# Patient Record
Sex: Female | Born: 1980 | Race: Black or African American | Hispanic: No | Smoking: Never smoker
Health system: Southern US, Community
[De-identification: ages and names within clinical notes are randomized; demographics above are authoritative.]

## PROBLEM LIST (undated history)

## (undated) ENCOUNTER — Inpatient Hospital Stay (HOSPITAL_COMMUNITY): Payer: Self-pay

## (undated) DIAGNOSIS — I10 Essential (primary) hypertension: Secondary | ICD-10-CM

## (undated) DIAGNOSIS — D573 Sickle-cell trait: Secondary | ICD-10-CM

## (undated) DIAGNOSIS — L309 Dermatitis, unspecified: Secondary | ICD-10-CM

## (undated) DIAGNOSIS — F909 Attention-deficit hyperactivity disorder, unspecified type: Secondary | ICD-10-CM

## (undated) DIAGNOSIS — F319 Bipolar disorder, unspecified: Secondary | ICD-10-CM

## (undated) DIAGNOSIS — A749 Chlamydial infection, unspecified: Secondary | ICD-10-CM

## (undated) DIAGNOSIS — F419 Anxiety disorder, unspecified: Secondary | ICD-10-CM

## (undated) HISTORY — PX: WISDOM TOOTH EXTRACTION: SHX21

---

## 2008-01-16 ENCOUNTER — Inpatient Hospital Stay (HOSPITAL_COMMUNITY): Admission: AD | Admit: 2008-01-16 | Discharge: 2008-01-16 | Payer: Self-pay | Admitting: Obstetrics & Gynecology

## 2008-07-27 ENCOUNTER — Inpatient Hospital Stay (HOSPITAL_COMMUNITY): Admission: AD | Admit: 2008-07-27 | Discharge: 2008-07-27 | Payer: Self-pay | Admitting: Obstetrics and Gynecology

## 2008-08-01 ENCOUNTER — Inpatient Hospital Stay (HOSPITAL_COMMUNITY): Admission: AD | Admit: 2008-08-01 | Discharge: 2008-08-01 | Payer: Self-pay | Admitting: Obstetrics & Gynecology

## 2008-08-04 ENCOUNTER — Inpatient Hospital Stay (HOSPITAL_COMMUNITY): Admission: AD | Admit: 2008-08-04 | Discharge: 2008-08-04 | Payer: Self-pay | Admitting: Obstetrics & Gynecology

## 2008-08-06 ENCOUNTER — Inpatient Hospital Stay (HOSPITAL_COMMUNITY): Admission: AD | Admit: 2008-08-06 | Discharge: 2008-08-06 | Payer: Self-pay | Admitting: Obstetrics & Gynecology

## 2008-08-09 ENCOUNTER — Inpatient Hospital Stay (HOSPITAL_COMMUNITY): Admission: RE | Admit: 2008-08-09 | Discharge: 2008-08-09 | Payer: Self-pay | Admitting: Obstetrics & Gynecology

## 2008-08-16 ENCOUNTER — Inpatient Hospital Stay (HOSPITAL_COMMUNITY): Admission: RE | Admit: 2008-08-16 | Discharge: 2008-08-16 | Payer: Self-pay | Admitting: Obstetrics & Gynecology

## 2008-08-23 ENCOUNTER — Inpatient Hospital Stay (HOSPITAL_COMMUNITY): Admission: RE | Admit: 2008-08-23 | Discharge: 2008-08-23 | Payer: Self-pay | Admitting: Obstetrics & Gynecology

## 2008-10-26 ENCOUNTER — Inpatient Hospital Stay (HOSPITAL_COMMUNITY): Admission: AD | Admit: 2008-10-26 | Discharge: 2008-10-27 | Payer: Self-pay | Admitting: Obstetrics & Gynecology

## 2009-01-06 ENCOUNTER — Emergency Department (HOSPITAL_COMMUNITY): Admission: EM | Admit: 2009-01-06 | Discharge: 2009-01-06 | Payer: Self-pay | Admitting: Emergency Medicine

## 2009-01-12 ENCOUNTER — Emergency Department (HOSPITAL_COMMUNITY): Admission: EM | Admit: 2009-01-12 | Discharge: 2009-01-13 | Payer: Self-pay | Admitting: Emergency Medicine

## 2011-01-05 LAB — URINALYSIS, ROUTINE W REFLEX MICROSCOPIC
Glucose, UA: NEGATIVE mg/dL
Hgb urine dipstick: NEGATIVE
Urobilinogen, UA: 0.2 mg/dL (ref 0.0–1.0)
pH: 6 (ref 5.0–8.0)

## 2011-01-05 LAB — CBC
MCHC: 32.7 g/dL (ref 30.0–36.0)
MCV: 84.8 fL (ref 78.0–100.0)
RBC: 4.75 MIL/uL (ref 3.87–5.11)
RDW: 14.6 % (ref 11.5–15.5)
WBC: 7.9 10*3/uL (ref 4.0–10.5)

## 2011-01-05 LAB — GC/CHLAMYDIA PROBE AMP, GENITAL
Chlamydia, DNA Probe: NEGATIVE
GC Probe Amp, Genital: NEGATIVE

## 2011-01-05 LAB — WET PREP, GENITAL: Clue Cells Wet Prep HPF POC: NONE SEEN

## 2011-01-05 LAB — POCT PREGNANCY, URINE: Preg Test, Ur: NEGATIVE

## 2011-02-28 ENCOUNTER — Emergency Department (HOSPITAL_BASED_OUTPATIENT_CLINIC_OR_DEPARTMENT_OTHER)
Admission: EM | Admit: 2011-02-28 | Discharge: 2011-02-28 | Disposition: A | Discharge status: Home / Self Care | Payer: Self-pay | Attending: Emergency Medicine | Admitting: Emergency Medicine

## 2011-02-28 DIAGNOSIS — M62838 Other muscle spasm: Secondary | ICD-10-CM | POA: Insufficient documentation

## 2011-02-28 DIAGNOSIS — R0789 Other chest pain: Secondary | ICD-10-CM | POA: Insufficient documentation

## 2011-06-15 LAB — URINALYSIS, ROUTINE W REFLEX MICROSCOPIC
Glucose, UA: NEGATIVE
Hgb urine dipstick: NEGATIVE
Ketones, ur: NEGATIVE
Protein, ur: NEGATIVE
Specific Gravity, Urine: <1.005 — ABNORMAL LOW
Urobilinogen, UA: 0.2
pH: 7

## 2011-06-15 LAB — POCT PREGNANCY, URINE
Operator id: 25114
Preg Test, Ur: NEGATIVE

## 2011-06-15 LAB — WET PREP, GENITAL

## 2011-06-15 LAB — CBC
HCT: 34.5 — ABNORMAL LOW
MCHC: 32.7
WBC: 5.9

## 2011-06-22 LAB — URINALYSIS, ROUTINE W REFLEX MICROSCOPIC
Specific Gravity, Urine: 1.025
Urobilinogen, UA: 0.2
pH: 6

## 2011-06-22 LAB — CBC
HCT: 35.6 — ABNORMAL LOW
Hemoglobin: 11.1 — ABNORMAL LOW
Hemoglobin: 11.4 — ABNORMAL LOW
MCHC: 32.1
MCHC: 33.5
MCV: 82.8
RBC: 3.99
RBC: 4.28
RDW: 15.1
WBC: 8.9

## 2011-06-22 LAB — GC/CHLAMYDIA PROBE AMP, GENITAL
Chlamydia, DNA Probe: NEGATIVE
GC Probe Amp, Genital: NEGATIVE

## 2011-06-22 LAB — HCG, QUANTITATIVE, PREGNANCY: hCG, Beta Chain, Quant, S: 2583 — ABNORMAL HIGH

## 2011-06-22 LAB — POCT PREGNANCY, URINE: Preg Test, Ur: POSITIVE

## 2011-08-01 ENCOUNTER — Encounter: Payer: Self-pay | Admitting: *Deleted

## 2011-08-01 ENCOUNTER — Emergency Department (HOSPITAL_BASED_OUTPATIENT_CLINIC_OR_DEPARTMENT_OTHER)
Admission: EM | Admit: 2011-08-01 | Discharge: 2011-08-01 | Disposition: A | Discharge status: Home / Self Care | Payer: Self-pay | Attending: Emergency Medicine | Admitting: Emergency Medicine

## 2011-08-01 DIAGNOSIS — IMO0001 Reserved for inherently not codable concepts without codable children: Secondary | ICD-10-CM | POA: Insufficient documentation

## 2011-08-01 DIAGNOSIS — J111 Influenza due to unidentified influenza virus with other respiratory manifestations: Secondary | ICD-10-CM | POA: Insufficient documentation

## 2011-08-01 DIAGNOSIS — J069 Acute upper respiratory infection, unspecified: Secondary | ICD-10-CM

## 2011-08-01 NOTE — ED Notes (Signed)
Pt states that her family has been sick and she has been congested for about 1-1/2 weeks. Body aches started today. Taking OTC meds without relief.

## 2011-08-01 NOTE — ED Provider Notes (Signed)
History  Scribed for Hurman Horn, MD, the patient was seen in room MH10. This chart was scribed by Hillery Hunter.   CSN: [id number] Arrival date & time: 08/01/2011 10:04 PM   First MD Initiated Contact with Patient 08/01/11 2205      Chief Complaint  Patient presents with  . Generalized Body Aches    The history is provided by the patient.    Ebony Hansen is a 30 y.o. female who presents to the Emergency Department complaining of two weeks of congestion and dry cough and describes additional symptoms of diarrhea, congestion, rhinorrhea, chills, sneezing, watery eyes that started yesterday. She wanted to come in because these symptoms together with generalized myalgias have made her feel gradually worse through today. She describes sick contacts at home with child and spouse sick with different symptoms. Benadryl at home improved her symptoms transiently.   History reviewed. No pertinent past medical history.  Past Surgical History  Procedure Date  . Cesarean section     History reviewed. No pertinent family history.  History  Substance Use Topics  . Smoking status: Not on file  . Smokeless tobacco: Not on file  . Alcohol Use:     Review of Systems  Constitutional: Positive for chills (today).  HENT: Positive for congestion (worse today), rhinorrhea and postnasal drip. Negative for hearing loss, ear pain and ear discharge.   Eyes: Positive for discharge (Watery eyes).  Respiratory: Positive for cough (dry, improving).   Cardiovascular: Positive for chest pain (chest wall sore with myalgias).  Gastrointestinal: Positive for nausea and diarrhea (starting yesterday).  Musculoskeletal: Positive for myalgias (today) and back pain (soreness with other myalgias).  Skin: Negative for rash.  Neurological: Negative for syncope.  Psychiatric/Behavioral: Negative for confusion.    Allergies  Sulfa drugs cross reactors  Home Medications   Current  Outpatient Rx  Name Route Sig Dispense Refill  . DIPHENHYDRAMINE HCL 25 MG PO TABS Oral Take 25 mg by mouth once.      Marland Kitchen VITAMIN C 500 MG PO TABS Oral Take 500-1,000 mg by mouth daily.        Triage vitals: BP 111/58  Pulse 70  Temp(Src) 98.3 F (36.8 C) (Oral)  Resp 20  Ht 5\' 3"  (1.6 m)  Wt 120 lb (54.432 kg)  BMI 21.26 kg/m2  SpO2 99%  LMP 07/22/2011  Physical Exam  Nursing note and vitals reviewed. Constitutional:       Awake, alert, nontoxic appearance.  HENT:  Head: Atraumatic.  Eyes: Right eye exhibits no discharge. Left eye exhibits no discharge.  Neck: Neck supple.  Cardiovascular: Normal rate, regular rhythm and normal heart sounds.   Pulmonary/Chest: Effort normal. No respiratory distress. She has no wheezes. She has no rales. She exhibits no tenderness.  Abdominal: Soft. There is no tenderness. There is no rebound.  Musculoskeletal: She exhibits no tenderness.       Baseline ROM, no obvious new focal weakness.  Lymphadenopathy:    She has no cervical adenopathy.  Neurological:       Mental status and motor strength appears baseline for patient and situation.  Skin: No rash noted.  Psychiatric: She has a normal mood and affect.    ED Course  Procedures   Labs Reviewed - No data to display No results found.   OTHER DATA REVIEWED: Nursing notes, vital signs reviewed  DIAGNOSTIC STUDIES: Oxygen Saturation is 99% on room air, normal by my interpretation.     ED COURSE / COORDINATION  OF CARE: Patient / Family / Caregiver informed of clinical course, understand medical decision-making process, and agree with plan.    MDM  I doubt any other EMC precluding discharge at this time including, but not necessarily limited to the following:SBI.   1. Influenza   2. Upper respiratory infection       I personally performed the services described in this documentation, which was scribed in my presence. The recorded information has been reviewed and  considered.    Hurman Horn, MD 08/02/11 [phone]

## 2011-08-02 ENCOUNTER — Encounter (HOSPITAL_BASED_OUTPATIENT_CLINIC_OR_DEPARTMENT_OTHER): Payer: Self-pay

## 2013-08-20 ENCOUNTER — Encounter (HOSPITAL_BASED_OUTPATIENT_CLINIC_OR_DEPARTMENT_OTHER): Payer: Self-pay | Admitting: Emergency Medicine

## 2013-08-20 ENCOUNTER — Emergency Department (HOSPITAL_BASED_OUTPATIENT_CLINIC_OR_DEPARTMENT_OTHER)
Admission: EM | Admit: 2013-08-20 | Discharge: 2013-08-20 | Disposition: A | Discharge status: Home / Self Care | Payer: Managed Care, Other (non HMO) | Attending: Emergency Medicine | Admitting: Emergency Medicine

## 2013-08-20 DIAGNOSIS — Z79899 Other long term (current) drug therapy: Secondary | ICD-10-CM | POA: Insufficient documentation

## 2013-08-20 DIAGNOSIS — Z3202 Encounter for pregnancy test, result negative: Secondary | ICD-10-CM | POA: Insufficient documentation

## 2013-08-20 DIAGNOSIS — N390 Urinary tract infection, site not specified: Secondary | ICD-10-CM

## 2013-08-20 LAB — PREGNANCY, URINE: Preg Test, Ur: NEGATIVE

## 2013-08-20 LAB — URINALYSIS, ROUTINE W REFLEX MICROSCOPIC
Bilirubin Urine: NEGATIVE
Nitrite: NEGATIVE
Specific Gravity, Urine: 1.022 (ref 1.005–1.030)
Urobilinogen, UA: 1 mg/dL (ref 0.0–1.0)
pH: 6 (ref 5.0–8.0)

## 2013-08-20 LAB — URINE MICROSCOPIC-ADD ON

## 2013-08-20 MED ORDER — NITROFURANTOIN MONOHYD MACRO 100 MG PO CAPS: 100.0000 mg | ORAL_CAPSULE | Freq: Two times a day (BID) | ORAL | Status: DC

## 2013-08-20 NOTE — ED Provider Notes (Addendum)
CSN: [id number]     Arrival date & time 08/20/13  1242 History   First MD Initiated Contact with Patient 08/20/13 1246     Chief Complaint  Patient presents with  . Hematuria   (Consider location/radiation/quality/duration/timing/severity/associated sxs/prior Treatment) Patient is a 32 y.o. female presenting with hematuria. The history is provided by the patient.  Hematuria This is a new problem. Episode onset: 3 days ago. The problem occurs constantly. The problem has been gradually worsening. Associated symptoms include abdominal pain. Associated symptoms comments: No fever, nausea or vomiting. Exacerbated by: urinating. Nothing relieves the symptoms. She has tried nothing for the symptoms. The treatment provided no relief.    History reviewed. No pertinent past medical history. Past Surgical History  Procedure Laterality Date  . Cesarean section     No family history on file. History  Substance Use Topics  . Smoking status: Never Smoker   . Smokeless tobacco: Not on file  . Alcohol Use: No   OB History   Grav Para Term Preterm Abortions TAB SAB Ect Mult Living                 Review of Systems  Gastrointestinal: Positive for abdominal pain. Negative for nausea and vomiting.  Genitourinary: Positive for dysuria, frequency and hematuria. Negative for vaginal bleeding and vaginal discharge.  All other systems reviewed and are negative.    Allergies  Sulfa drugs cross reactors  Home Medications   Current Outpatient Rx  Name  Route  Sig  Dispense  Refill  . diphenhydrAMINE (BENADRYL ALLERGY) 25 MG tablet   Oral   Take 25 mg by mouth once.           . vitamin C (ASCORBIC ACID) 500 MG tablet   Oral   Take 500-1,000 mg by mouth daily.            BP 120/71  Pulse 66  Temp(Src) 98.1 F (36.7 C) (Oral)  Resp 16  SpO2 100%  LMP 08/13/2013 Physical Exam  Nursing note and vitals reviewed. Constitutional: She is oriented to person, place, and time. She appears  well-developed and well-nourished. No distress.  HENT:  Head: Normocephalic and atraumatic.  Mouth/Throat: Oropharynx is clear and moist.  Eyes: Conjunctivae and EOM are normal. Pupils are equal, round, and reactive to light.  Neck: Normal range of motion. Neck supple.  Cardiovascular: Normal rate, regular rhythm and intact distal pulses.   No murmur heard. Pulmonary/Chest: Effort normal and breath sounds normal. No respiratory distress. She has no wheezes. She has no rales.  Abdominal: Soft. She exhibits no distension. There is tenderness in the suprapubic area. There is no rebound, no guarding and no CVA tenderness.  Musculoskeletal: Normal range of motion. She exhibits no edema and no tenderness.  Neurological: She is alert and oriented to person, place, and time.  Skin: Skin is warm and dry. No rash noted. No erythema.  Psychiatric: She has a normal mood and affect. Her behavior is normal.    ED Course  Procedures (including critical care time) Labs Review Labs Reviewed  URINALYSIS, ROUTINE W REFLEX MICROSCOPIC - Abnormal; Notable for the following:    Color, Urine AMBER (*)    APPearance CLOUDY (*)    Hgb urine dipstick LARGE (*)    Ketones, ur 15 (*)    Protein, ur >300 (*)    Leukocytes, UA MODERATE (*)    All other components within normal limits  URINE MICROSCOPIC-ADD ON - Abnormal; Notable for the following:  Bacteria, UA MANY (*)    All other components within normal limits  URINE CULTURE  PREGNANCY, URINE   Imaging Review No results found.  EKG Interpretation   None       MDM   1. UTI (lower urinary tract infection)     Patient with symptoms most consistent with a UTI and UA confirming UTI. No physical exam signs or history concerning for pyelonephritis. Patient denies any vaginal symptoms and recently had a full evaluation and OB/GYN. Will treat with Macrobid this patient has a sulfa allergy.    Gwyneth Sprout, MD 08/20/13 1341  Gwyneth Sprout, MD 08/20/13 1343

## 2013-08-20 NOTE — ED Notes (Signed)
Pt reports blood in urine since Saturday. Pain in lower abdomen. No burning with urination. No hx of kidney stones.

## 2013-08-22 LAB — URINE CULTURE

## 2013-09-05 ENCOUNTER — Inpatient Hospital Stay (HOSPITAL_COMMUNITY)
Admission: AD | Admit: 2013-09-05 | Discharge: 2013-09-05 | Disposition: A | Discharge status: Home / Self Care | Payer: Managed Care, Other (non HMO) | Source: Ambulatory Visit | Attending: Obstetrics & Gynecology | Admitting: Obstetrics & Gynecology

## 2013-09-05 ENCOUNTER — Encounter (HOSPITAL_COMMUNITY): Payer: Self-pay | Admitting: *Deleted

## 2013-09-05 DIAGNOSIS — R109 Unspecified abdominal pain: Secondary | ICD-10-CM | POA: Insufficient documentation

## 2013-09-05 DIAGNOSIS — R102 Pelvic and perineal pain: Secondary | ICD-10-CM

## 2013-09-05 DIAGNOSIS — N949 Unspecified condition associated with female genital organs and menstrual cycle: Secondary | ICD-10-CM | POA: Insufficient documentation

## 2013-09-05 DIAGNOSIS — K644 Residual hemorrhoidal skin tags: Secondary | ICD-10-CM | POA: Insufficient documentation

## 2013-09-05 HISTORY — DX: Chlamydial infection, unspecified: A74.9

## 2013-09-05 HISTORY — DX: Sickle-cell trait: D57.3

## 2013-09-05 LAB — POCT PREGNANCY, URINE: Preg Test, Ur: NEGATIVE

## 2013-09-05 LAB — URINALYSIS, ROUTINE W REFLEX MICROSCOPIC
Glucose, UA: NEGATIVE mg/dL
Ketones, ur: NEGATIVE mg/dL
Leukocytes, UA: NEGATIVE
Nitrite: NEGATIVE
Protein, ur: NEGATIVE mg/dL
Urobilinogen, UA: 0.2 mg/dL (ref 0.0–1.0)

## 2013-09-05 LAB — WET PREP, GENITAL: Clue Cells Wet Prep HPF POC: NONE SEEN

## 2013-09-05 MED ORDER — KETOROLAC TROMETHAMINE 60 MG/2ML IM SOLN
60.0000 mg | Freq: Once | INTRAMUSCULAR | Status: AC
  Administered 2013-09-05: 60 mg via INTRAMUSCULAR
  Filled 2013-09-05: qty 2

## 2013-09-05 NOTE — MAU Note (Signed)
Abdominal pain since 12/1, dx'd with UTI @ that time.  Possibly pregnant.

## 2013-09-05 NOTE — MAU Provider Note (Signed)
History     CSN: [id number]  Arrival date and time: 09/05/13 1630   First Provider Initiated Contact with Patient 09/05/13 1723      Chief Complaint  Patient presents with  . Abdominal Pain   HPI Comments: Ebony Hansen 32 y.o. [id number] presents to MAU for low pelvic pains following UTI. She took all of her antibiotics and the pain returned after that. She is not on birth control as she and her husband are trying to get pregnant. The pain has been there for 2-3 weeks, comes and goes . She had a pelvic ultrasound in [place] about 2 weeks ago that was negative.      Past Medical History  Diagnosis Date  . Sickle cell trait   . Chlamydia     Past Surgical History  Procedure Laterality Date  . Cesarean section      History reviewed. No pertinent family history.  History  Substance Use Topics  . Smoking status: Never Smoker   . Smokeless tobacco: Not on file  . Alcohol Use: No    Allergies:  Allergies  Allergen Reactions  . Sulfa Drugs Cross Reactors Anaphylaxis and Other (See Comments)    Causes drooling.    Prescriptions prior to admission  Medication Sig Dispense Refill  . citalopram (CELEXA) 20 MG tablet Take 20 mg by mouth daily.      . traMADol (ULTRAM) 50 MG tablet Take 50 mg by mouth daily as needed (For back pain.).        Review of Systems  Constitutional: Negative.   HENT: Negative.   Respiratory: Negative.   Cardiovascular: Negative.   Gastrointestinal: Positive for abdominal pain.  Genitourinary: Negative.   Skin: Negative.   Neurological: Negative.   Psychiatric/Behavioral: Negative.    Physical Exam   Blood pressure 128/70, pulse 69, temperature 98.7 F (37.1 C), temperature source Oral, resp. rate 18, height 5\' 3"  (1.6 m), weight 72.031 kg (158 lb 12.8 oz), last menstrual period 08/06/2013.  Physical Exam  Constitutional: She is oriented to person, place, and time. She appears well-developed and well-nourished. No distress.   HENT:  Head: Normocephalic and atraumatic.  Cardiovascular: Normal rate, regular rhythm and normal heart sounds.   Respiratory: Effort normal and breath sounds normal.  GI: Soft. Bowel sounds are normal. There is tenderness.  Genitourinary:  External: Negative other than very large cluster of external hemorrhoids Vaginal: moderate amount thin white nonodorous discharge Cervix: negative Biman: Slight CMT  Musculoskeletal: Normal range of motion.  Neurological: She is alert and oriented to person, place, and time.  Skin: Skin is warm and dry.  Psychiatric: She has a normal mood and affect. Her behavior is normal. Judgment and thought content normal.   Results for orders placed during the hospital encounter of 09/05/13 (from the past 24 hour(s))  URINALYSIS, ROUTINE W REFLEX MICROSCOPIC     Status: None   Collection Time    09/05/13  5:00 PM      Result Value Range   Color, Urine YELLOW  YELLOW   APPearance CLEAR  CLEAR   Specific Gravity, Urine 1.010  1.005 - 1.030   pH 7.0  5.0 - 8.0   Glucose, UA NEGATIVE  NEGATIVE mg/dL   Hgb urine dipstick NEGATIVE  NEGATIVE   Bilirubin Urine NEGATIVE  NEGATIVE   Ketones, ur NEGATIVE  NEGATIVE mg/dL   Protein, ur NEGATIVE  NEGATIVE mg/dL   Urobilinogen, UA 0.2  0.0 - 1.0 mg/dL   Nitrite NEGATIVE  NEGATIVE  Leukocytes, UA NEGATIVE  NEGATIVE  POCT PREGNANCY, URINE     Status: None   Collection Time    09/05/13  5:33 PM      Result Value Range   Preg Test, Ur NEGATIVE  NEGATIVE  WET PREP, GENITAL     Status: Abnormal   Collection Time    09/05/13  6:51 PM      Result Value Range   Yeast Wet Prep HPF POC NONE SEEN  NONE SEEN   Trich, Wet Prep NONE SEEN  NONE SEEN   Clue Cells Wet Prep HPF POC NONE SEEN  NONE SEEN   WBC, Wet Prep HPF POC FEW (*) NONE SEEN    MAU Course  Procedures  MDM Wet prep Toradol 60 mg IM now/ pain is gone  Assessment and Plan   A: Pelvic pain  P: Toradol 60 mg Follow up with GYN   Carolynn Serve 09/05/2013, 7:17 PM

## 2013-09-07 NOTE — MAU Provider Note (Signed)
Attestation of Attending Supervision of Advanced Practitioner: Evaluation and management procedures were performed by the PA/NP/CNM/OB Fellow under my supervision/collaboration. Chart reviewed and agree with management and plan.  Eldred Sooy V 09/07/2013 8:24 PM

## 2013-10-06 ENCOUNTER — Encounter (HOSPITAL_BASED_OUTPATIENT_CLINIC_OR_DEPARTMENT_OTHER): Payer: Self-pay | Admitting: Emergency Medicine

## 2013-10-06 ENCOUNTER — Emergency Department (HOSPITAL_BASED_OUTPATIENT_CLINIC_OR_DEPARTMENT_OTHER)
Admission: EM | Admit: 2013-10-06 | Discharge: 2013-10-06 | Disposition: A | Discharge status: Home / Self Care | Payer: Managed Care, Other (non HMO) | Attending: Emergency Medicine | Admitting: Emergency Medicine

## 2013-10-06 DIAGNOSIS — Z862 Personal history of diseases of the blood and blood-forming organs and certain disorders involving the immune mechanism: Secondary | ICD-10-CM | POA: Insufficient documentation

## 2013-10-06 DIAGNOSIS — Z79899 Other long term (current) drug therapy: Secondary | ICD-10-CM | POA: Insufficient documentation

## 2013-10-06 DIAGNOSIS — J069 Acute upper respiratory infection, unspecified: Secondary | ICD-10-CM | POA: Insufficient documentation

## 2013-10-06 DIAGNOSIS — Z8619 Personal history of other infectious and parasitic diseases: Secondary | ICD-10-CM | POA: Insufficient documentation

## 2013-10-06 MED ORDER — IBUPROFEN 800 MG PO TABS
800.0000 mg | ORAL_TABLET | Freq: Once | ORAL | Status: AC
  Administered 2013-10-06: 800 mg via ORAL
  Filled 2013-10-06: qty 1

## 2013-10-06 NOTE — Discharge Instructions (Signed)
Cool Mist Vaporizers °Vaporizers may help relieve the symptoms of a cough and cold. They add moisture to the air, which helps mucus to become thinner and less sticky. This makes it easier to breathe and cough up secretions. Cool mist vaporizers do not cause serious burns like hot mist vaporizers ("steamers, humidifiers"). Vaporizers have not been proved to show they help with colds. You should not use a vaporizer if you are allergic to mold.  °HOME CARE INSTRUCTIONS °· Follow the package instructions for the vaporizer. °· Do not use anything other than distilled water in the vaporizer. °· Do not run the vaporizer all of the time. This can cause mold or bacteria to grow in the vaporizer. °· Clean the vaporizer after each time it is used. °· Clean and dry the vaporizer well before storing it. °· Stop using the vaporizer if worsening respiratory symptoms develop. °Document Released: 06/03/2004 Document Revised: 05/09/2013 Document Reviewed: 01/24/2013 °ExitCare® Patient Information ©2014 [employer]. ° °

## 2013-10-06 NOTE — ED Notes (Signed)
Pt. Reports she has had cold symptoms and cough since Monday and taking OTC meds.  Pt. Repots she has headache and bodyaches and stomach pain.  Pt. Reports cough making her sore.  Non productive cough.

## 2013-10-06 NOTE — ED Provider Notes (Signed)
CSN: [id number]     Arrival date & time 10/06/13  0026 History   First MD Initiated Contact with Patient 10/06/13 0041     Chief Complaint  Patient presents with  . Influenza   (Consider location/radiation/quality/duration/timing/severity/associated sxs/prior Treatment) Patient is a 33 y.o. female presenting with flu symptoms. The history is provided by the patient.  Influenza Presenting symptoms: cough and rhinorrhea   Presenting symptoms: no diarrhea and no shortness of breath   Cough:    Cough characteristics:  Non-productive   Severity:  Moderate   Onset quality:  Gradual   Duration:  5 days   Progression:  Unchanged   Chronicity:  New Rhinorrhea:    Quality:  Clear   Severity:  Moderate   Timing:  Constant   Progression:  Unchanged Severity:  Moderate Onset quality:  Gradual Progression:  Unchanged Chronicity:  New Relieved by:  Nothing Worsened by:  Nothing tried Ineffective treatments:  None tried Associated symptoms: nasal congestion   Associated symptoms: no chills, no mental status change and no neck stiffness   Risk factors: not elderly     Past Medical History  Diagnosis Date  . Sickle cell trait   . Chlamydia    Past Surgical History  Procedure Laterality Date  . Cesarean section     No family history on file. History  Substance Use Topics  . Smoking status: Never Smoker   . Smokeless tobacco: Not on file  . Alcohol Use: No   OB History   Grav Para Term Preterm Abortions TAB SAB Ect Mult Living   5 2 2  3 1 2   2      Review of Systems  Constitutional: Negative for chills.  HENT: Positive for congestion and rhinorrhea.   Respiratory: Positive for cough. Negative for shortness of breath.   Gastrointestinal: Negative for diarrhea.  Musculoskeletal: Negative for neck stiffness.  All other systems reviewed and are negative.    Allergies  Sulfa drugs cross reactors  Home Medications   Current Outpatient Rx  Name  Route  Sig  Dispense   Refill  . citalopram (CELEXA) 20 MG tablet   Oral   Take 20 mg by mouth daily.         . traMADol (ULTRAM) 50 MG tablet   Oral   Take 50 mg by mouth daily as needed (For back pain.).          BP 119/72  Pulse 73  Temp(Src) 98.7 F (37.1 C) (Oral)  Resp 20  SpO2 100%  LMP 09/07/2013 Physical Exam  Constitutional: She is oriented to person, place, and time. She appears well-developed and well-nourished. No distress.  HENT:  Head: Normocephalic and atraumatic.  Mouth/Throat: No oropharyngeal exudate.  Eyes: EOM are normal. Pupils are equal, round, and reactive to light.  Neck: Normal range of motion. Neck supple.  Cardiovascular: Normal rate, regular rhythm and intact distal pulses.   Pulmonary/Chest: Effort normal and breath sounds normal. No respiratory distress. She has no wheezes. She has no rales.  Abdominal: Soft. Bowel sounds are normal. There is no tenderness. There is no rebound and no guarding.  Musculoskeletal: Normal range of motion.  Lymphadenopathy:    She has no cervical adenopathy.  Neurological: She is alert and oriented to person, place, and time.  Skin: Skin is warm and dry.  Psychiatric: She has a normal mood and affect.    ED Course  Procedures (including critical care time) Labs Review Labs Reviewed - No data  to display Imaging Review No results found.  EKG Interpretation   None       MDM   1. URI (upper respiratory infection)    Alternate tylenol and ibuprofen and use cool mist vaporizer    Coleton Woon K Miriya Cloer-Rasch, MD 10/06/13 [phone]

## 2013-10-06 NOTE — ED Notes (Signed)
Body aches, cough, ha onset monday

## 2013-12-11 ENCOUNTER — Emergency Department (HOSPITAL_BASED_OUTPATIENT_CLINIC_OR_DEPARTMENT_OTHER)
Admission: EM | Admit: 2013-12-11 | Discharge: 2013-12-11 | Disposition: A | Discharge status: Home / Self Care | Payer: Managed Care, Other (non HMO) | Attending: Emergency Medicine | Admitting: Emergency Medicine

## 2013-12-11 ENCOUNTER — Encounter (HOSPITAL_BASED_OUTPATIENT_CLINIC_OR_DEPARTMENT_OTHER): Payer: Self-pay | Admitting: Emergency Medicine

## 2013-12-11 DIAGNOSIS — F909 Attention-deficit hyperactivity disorder, unspecified type: Secondary | ICD-10-CM | POA: Insufficient documentation

## 2013-12-11 DIAGNOSIS — F411 Generalized anxiety disorder: Secondary | ICD-10-CM | POA: Insufficient documentation

## 2013-12-11 DIAGNOSIS — Z8619 Personal history of other infectious and parasitic diseases: Secondary | ICD-10-CM | POA: Insufficient documentation

## 2013-12-11 DIAGNOSIS — Z79899 Other long term (current) drug therapy: Secondary | ICD-10-CM | POA: Insufficient documentation

## 2013-12-11 DIAGNOSIS — S61209A Unspecified open wound of unspecified finger without damage to nail, initial encounter: Secondary | ICD-10-CM | POA: Insufficient documentation

## 2013-12-11 DIAGNOSIS — Z862 Personal history of diseases of the blood and blood-forming organs and certain disorders involving the immune mechanism: Secondary | ICD-10-CM | POA: Insufficient documentation

## 2013-12-11 DIAGNOSIS — Y93E8 Activity, other personal hygiene: Secondary | ICD-10-CM | POA: Insufficient documentation

## 2013-12-11 DIAGNOSIS — W298XXA Contact with other powered powered hand tools and household machinery, initial encounter: Secondary | ICD-10-CM | POA: Insufficient documentation

## 2013-12-11 DIAGNOSIS — Y9289 Other specified places as the place of occurrence of the external cause: Secondary | ICD-10-CM | POA: Insufficient documentation

## 2013-12-11 DIAGNOSIS — S61309A Unspecified open wound of unspecified finger with damage to nail, initial encounter: Secondary | ICD-10-CM

## 2013-12-11 DIAGNOSIS — Z23 Encounter for immunization: Secondary | ICD-10-CM | POA: Insufficient documentation

## 2013-12-11 HISTORY — DX: Anxiety disorder, unspecified: F41.9

## 2013-12-11 HISTORY — DX: Attention-deficit hyperactivity disorder, unspecified type: F90.9

## 2013-12-11 MED ORDER — TETANUS-DIPHTH-ACELL PERTUSSIS 5-2.5-18.5 LF-MCG/0.5 IM SUSP
0.5000 mL | Freq: Once | INTRAMUSCULAR | Status: AC
  Administered 2013-12-11: 0.5 mL via INTRAMUSCULAR
  Filled 2013-12-11: qty 0.5

## 2013-12-11 NOTE — ED Notes (Addendum)
Shaved off top of left 4th fingernail with razor. Pt sts tetanus is UTD.

## 2013-12-11 NOTE — ED Provider Notes (Signed)
CSN: [id number]     Arrival date & time 12/11/13  2201 History  This chart was scribed for Charles B. Bernette MayersSheldon, MD by Beverly MilchJ Harrison Collins, ED Scribe. This patient was seen in room MH09/MH09 and the patient's care was started at 10:15 PM.   Chief Complaint  Patient presents with  . Extremity Laceration     The history is provided by the patient. No language interpreter was used.   HPI Comments: Ebony Hansen is a 33 y.o. female who presents to the Emergency Department complaining of laceration to left ring finger that occurred earlier this evening. She states she was in the shower shaving and caught her finger on the razor and hasn't been able to stop the bleeding. She reports wrapping it and applying pressure to stop bleeding. Pt states she is unsure of when she received her last tetanus shot.    Past Medical History  Diagnosis Date  . Sickle cell trait   . Chlamydia   . Anxiety   . ADHD (attention deficit hyperactivity disorder)    Past Surgical History  Procedure Laterality Date  . Cesarean section     No family history on file. History  Substance Use Topics  . Smoking status: Never Smoker   . Smokeless tobacco: Not on file  . Alcohol Use: No   OB History   Grav Para Term Preterm Abortions TAB SAB Ect Mult Living   5 2 2  3 1 2   2      Review of Systems A complete 10 system review of systems was obtained and all systems are negative except as noted in the HPI and PMH.     Allergies  Sulfa drugs cross reactors  Home Medications   Current Outpatient Rx  Name  Route  Sig  Dispense  Refill  . atomoxetine (STRATTERA) 10 MG capsule   Oral   Take 10 mg by mouth daily.         . citalopram (CELEXA) 20 MG tablet   Oral   Take 20 mg by mouth daily.         . traMADol (ULTRAM) 50 MG tablet   Oral   Take 50 mg by mouth daily as needed (For back pain.).          Triage Vitals: BP 121/72  Pulse 78  Temp(Src) 98.9 F (37.2 C) (Oral)  Resp 18  Ht 5\' 3"   (1.6 m)  Wt 158 lb (71.668 kg)  BMI 28.00 kg/m2  SpO2 100%  LMP 11/14/2013  Physical Exam  Nursing note and vitals reviewed. Constitutional: She is oriented to person, place, and time. She appears well-developed and well-nourished.  HENT:  Head: Normocephalic and atraumatic.  Neck: Neck supple.  Pulmonary/Chest: Effort normal.  Musculoskeletal:  Small, 75mmx5mm avulsion of distal nail on L ring finger with superficial skive injury of underlying nailbed, not in need of primary repair  Neurological: She is alert and oriented to person, place, and time. No cranial nerve deficit.  Psychiatric: She has a normal mood and affect. Her behavior is normal.    ED Course  Procedures (including critical care time)  DIAGNOSTIC STUDIES: Oxygen Saturation is 100% on RA, normal by my interpretation.    COORDINATION OF CARE: 10:18 PM- Pt advised of plan for treatment and pt agrees.    Labs Review Labs Reviewed - No data to display Imaging Review No results found.   EKG Interpretation None      MDM   Final diagnoses:  Fingernail avulsion, partial    Nail and nail bed injury not in need of repair. Tdap, non-stick dressing and wound care advise.   I personally performed the services described in this documentation, which was scribed in my presence. The recorded information has been reviewed and is accurate.      Charles B. Bernette Mayers, MD 12/11/13 2238

## 2013-12-11 NOTE — Discharge Instructions (Signed)
Fingernail or Toenail Loss  All or part of your fingernail or toenail has been lost. This may or may not grow back as a normal nail. A special non-stick bandage has been put on your finger or toe tightly to prevent bleeding.  HOME CARE INSTRUCTIONS   The tips of fingers and toes are full of nerves and injuries are often very painful. The following will help you decrease the pain and obtain the best outcome.  · Keep your hand or foot elevated above your heart to relieve pain and swelling. This will require lying in bed or on a couch with the hand or leg on pillows or sitting in a recliner with the leg up. Letting your hand or leg dangle may increase swelling, slow healing and cause throbbing pain.  · Keep your dressing dry and clean.  · Change your bandage in 24 hours after going home.  · After your bandage is changed, soak your hand or foot in warm soapy water for 10 to 20 minutes. Do this 3 times per day. This helps reduce pain and swelling. After soaking, apply a clean, dry bandage. Change your bandage if it is wet or dirty.  · Only take over-the-counter or prescription medicines for pain, discomfort, or fever as directed by your caregiver.  · See your caregiver as needed for problems.  SEEK IMMEDIATE MEDICAL CARE IF:   · You have increased pain, swelling, drainage, or bleeding.  · You have a fever.  MAKE SURE YOU:   · Understand these instructions.  · Will watch your condition.  · Will get help right away if you are not doing well or get worse.  Document Released: 07/29/2006 Document Revised: 11/29/2011 Document Reviewed: 10/18/2006  Exit[employer]® Patient Information ©2014 [employer].

## 2014-03-27 ENCOUNTER — Ambulatory Visit: Payer: Managed Care, Other (non HMO) | Attending: Family Medicine | Admitting: Rehabilitation

## 2014-04-10 ENCOUNTER — Ambulatory Visit: Payer: Managed Care, Other (non HMO)

## 2014-06-26 ENCOUNTER — Encounter (HOSPITAL_BASED_OUTPATIENT_CLINIC_OR_DEPARTMENT_OTHER): Payer: Self-pay | Admitting: Emergency Medicine

## 2014-06-26 ENCOUNTER — Emergency Department (HOSPITAL_BASED_OUTPATIENT_CLINIC_OR_DEPARTMENT_OTHER)
Admission: EM | Admit: 2014-06-26 | Discharge: 2014-06-26 | Disposition: A | Discharge status: Home / Self Care | Payer: BC Managed Care – PPO | Attending: Emergency Medicine | Admitting: Emergency Medicine

## 2014-06-26 DIAGNOSIS — F419 Anxiety disorder, unspecified: Secondary | ICD-10-CM | POA: Diagnosis not present

## 2014-06-26 DIAGNOSIS — Z79899 Other long term (current) drug therapy: Secondary | ICD-10-CM | POA: Diagnosis not present

## 2014-06-26 DIAGNOSIS — K088 Other specified disorders of teeth and supporting structures: Secondary | ICD-10-CM | POA: Insufficient documentation

## 2014-06-26 DIAGNOSIS — Z862 Personal history of diseases of the blood and blood-forming organs and certain disorders involving the immune mechanism: Secondary | ICD-10-CM | POA: Diagnosis not present

## 2014-06-26 DIAGNOSIS — K029 Dental caries, unspecified: Secondary | ICD-10-CM | POA: Diagnosis not present

## 2014-06-26 DIAGNOSIS — Z8619 Personal history of other infectious and parasitic diseases: Secondary | ICD-10-CM | POA: Diagnosis not present

## 2014-06-26 DIAGNOSIS — H9201 Otalgia, right ear: Secondary | ICD-10-CM | POA: Diagnosis present

## 2014-06-26 DIAGNOSIS — F909 Attention-deficit hyperactivity disorder, unspecified type: Secondary | ICD-10-CM | POA: Insufficient documentation

## 2014-06-26 DIAGNOSIS — K0889 Other specified disorders of teeth and supporting structures: Secondary | ICD-10-CM

## 2014-06-26 MED ORDER — PENICILLIN V POTASSIUM 500 MG PO TABS: 500.0000 mg | ORAL_TABLET | Freq: Four times a day (QID) | ORAL | Status: AC

## 2014-06-26 MED ORDER — KETOROLAC TROMETHAMINE 60 MG/2ML IM SOLN
60.0000 mg | Freq: Once | INTRAMUSCULAR | Status: AC
  Administered 2014-06-26: 60 mg via INTRAMUSCULAR
  Filled 2014-06-26: qty 2

## 2014-06-26 NOTE — ED Provider Notes (Signed)
CSN: [id number]     Arrival date & time 06/26/14  0259 History   First MD Initiated Contact with Patient 06/26/14 0413     Chief Complaint  Patient presents with  . Otalgia     (Consider location/radiation/quality/duration/timing/severity/associated sxs/prior Treatment) Patient is a 33 y.o. female presenting with ear pain.  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 Associated symptoms: no ear discharge, no fever and no sore throat   Associated symptoms comment:  Dental pain   Past Medical History  Diagnosis Date  . Sickle cell trait   . Chlamydia   . Anxiety   . ADHD (attention deficit hyperactivity disorder)    Past Surgical History  Procedure Laterality Date  . Cesarean section     No family history on file. History  Substance Use Topics  . Smoking status: Never Smoker   . Smokeless tobacco: Not on file  . Alcohol Use: No   OB History   Grav Para Term Preterm Abortions TAB SAB Ect Mult Living   5 2 2  3 1 2   2      Review of Systems  Constitutional: Negative for fever.  HENT: Positive for ear pain. Negative for ear discharge and sore throat.   All other systems reviewed and are negative.     Allergies  Sulfa drugs cross reactors  Home Medications   Prior to Admission medications   Medication Sig Start Date End Date Taking? Authorizing Provider  atomoxetine (STRATTERA) 10 MG capsule Take 10 mg by mouth daily.    Historical Provider, MD  citalopram (CELEXA) 20 MG tablet Take 20 mg by mouth daily.    Historical Provider, MD  traMADol (ULTRAM) 50 MG tablet Take 50 mg by mouth daily as needed (For back pain.).    Historical Provider, MD   BP 134/78  Pulse 86  Temp(Src)  98.9 F (37.2 C) (Oral)  Resp 18  Ht 5\' 3"  (1.6 m)  Wt 145 lb (65.772 kg)  BMI 25.69 kg/m2  SpO2 100%  LMP 06/06/2014 Physical Exam  Nursing note and vitals reviewed. Constitutional: She is oriented to person, place, and time. She appears well-developed and well-nourished. No distress.  HENT:  Head: Normocephalic and atraumatic.  Right Ear: Hearing, tympanic membrane, external ear and ear canal normal.  Left Ear: Hearing, tympanic membrane, external ear and ear canal normal.  Mouth/Throat: Mucous membranes are normal.  Severe dental caries.  TTP of right upper 1st molar which is severely decayed.    Eyes: Conjunctivae are normal. No scleral icterus.  Neck: Neck supple.  Cardiovascular: Normal rate and intact distal pulses.   Pulmonary/Chest: Effort normal. No stridor. No respiratory distress.  Abdominal: Normal appearance. She exhibits no distension.  Neurological: She is alert and oriented to person, place, and time.  Skin: Skin is warm and dry. No rash noted.  Psychiatric: She has a normal mood and affect. Her behavior is normal.    ED Course  Procedures (including critical care time) Labs Review Labs Reviewed - No data to display  Imaging Review No results found.   EKG Interpretation None      MDM   Final diagnoses:  Pain, dental    33 yo female with complaint of right ear pain.  By exam, pain originating from right upper tooth.  Some mild associated swelling with this tooth.  Will treat for dental abscess.  Advised her to see her dentist ASAP.      Warnell Foresterrey Lorenzo Arscott, MD 06/26/14 0500

## 2014-06-26 NOTE — ED Notes (Signed)
Pt c/o rt ear ache, rt lower tooth ache x2days

## 2014-07-22 ENCOUNTER — Encounter (HOSPITAL_BASED_OUTPATIENT_CLINIC_OR_DEPARTMENT_OTHER): Payer: Self-pay | Admitting: Emergency Medicine

## 2015-06-03 ENCOUNTER — Emergency Department (HOSPITAL_BASED_OUTPATIENT_CLINIC_OR_DEPARTMENT_OTHER): Payer: BLUE CROSS/BLUE SHIELD

## 2015-06-03 ENCOUNTER — Emergency Department (HOSPITAL_BASED_OUTPATIENT_CLINIC_OR_DEPARTMENT_OTHER)
Admission: EM | Admit: 2015-06-03 | Discharge: 2015-06-04 | Disposition: A | Discharge status: Home / Self Care | Payer: BLUE CROSS/BLUE SHIELD | Attending: Emergency Medicine | Admitting: Emergency Medicine

## 2015-06-03 ENCOUNTER — Encounter (HOSPITAL_BASED_OUTPATIENT_CLINIC_OR_DEPARTMENT_OTHER): Payer: Self-pay | Admitting: Emergency Medicine

## 2015-06-03 DIAGNOSIS — M791 Myalgia: Secondary | ICD-10-CM | POA: Insufficient documentation

## 2015-06-03 DIAGNOSIS — J069 Acute upper respiratory infection, unspecified: Secondary | ICD-10-CM | POA: Diagnosis not present

## 2015-06-03 DIAGNOSIS — R05 Cough: Secondary | ICD-10-CM | POA: Diagnosis present

## 2015-06-03 DIAGNOSIS — Z8619 Personal history of other infectious and parasitic diseases: Secondary | ICD-10-CM | POA: Insufficient documentation

## 2015-06-03 DIAGNOSIS — R1084 Generalized abdominal pain: Secondary | ICD-10-CM | POA: Diagnosis not present

## 2015-06-03 DIAGNOSIS — F909 Attention-deficit hyperactivity disorder, unspecified type: Secondary | ICD-10-CM | POA: Diagnosis not present

## 2015-06-03 DIAGNOSIS — Z79899 Other long term (current) drug therapy: Secondary | ICD-10-CM | POA: Insufficient documentation

## 2015-06-03 DIAGNOSIS — Z862 Personal history of diseases of the blood and blood-forming organs and certain disorders involving the immune mechanism: Secondary | ICD-10-CM | POA: Insufficient documentation

## 2015-06-03 DIAGNOSIS — E860 Dehydration: Secondary | ICD-10-CM | POA: Diagnosis not present

## 2015-06-03 DIAGNOSIS — F419 Anxiety disorder, unspecified: Secondary | ICD-10-CM | POA: Insufficient documentation

## 2015-06-03 DIAGNOSIS — R112 Nausea with vomiting, unspecified: Secondary | ICD-10-CM | POA: Diagnosis not present

## 2015-06-03 MED ORDER — ONDANSETRON HCL 4 MG/2ML IJ SOLN
4.0000 mg | Freq: Once | INTRAMUSCULAR | Status: AC
  Administered 2015-06-03: 4 mg via INTRAVENOUS
  Filled 2015-06-03: qty 2

## 2015-06-03 MED ORDER — ONDANSETRON HCL 8 MG PO TABS
4.0000 mg | ORAL_TABLET | Freq: Once | ORAL | Status: AC
  Administered 2015-06-03: 4 mg via ORAL
  Filled 2015-06-03: qty 1

## 2015-06-03 MED ORDER — ALBUTEROL SULFATE (2.5 MG/3ML) 0.083% IN NEBU
5.0000 mg | INHALATION_SOLUTION | Freq: Once | RESPIRATORY_TRACT | Status: AC
  Administered 2015-06-03: 5 mg via RESPIRATORY_TRACT
  Filled 2015-06-03: qty 6

## 2015-06-03 MED ORDER — PROMETHAZINE HCL 25 MG PO TABS: 25.0000 mg | ORAL_TABLET | Freq: Four times a day (QID) | ORAL | Status: DC | PRN

## 2015-06-03 MED ORDER — SODIUM CHLORIDE 0.9 % IV BOLUS (SEPSIS)
1000.0000 mL | Freq: Once | INTRAVENOUS | Status: AC
  Administered 2015-06-03: 1000 mL via INTRAVENOUS

## 2015-06-03 MED ORDER — ACETAMINOPHEN 325 MG PO TABS
650.0000 mg | ORAL_TABLET | Freq: Once | ORAL | Status: AC
  Administered 2015-06-04: 650 mg via ORAL
  Filled 2015-06-03: qty 2

## 2015-06-03 MED ORDER — PROMETHAZINE HCL 25 MG/ML IJ SOLN
12.5000 mg | Freq: Once | INTRAMUSCULAR | Status: AC
  Administered 2015-06-03: 12.5 mg via INTRAVENOUS
  Filled 2015-06-03: qty 1

## 2015-06-03 NOTE — ED Notes (Signed)
MD at bedside. 

## 2015-06-03 NOTE — Progress Notes (Signed)
Patient became nauseous while taking nebulizer and was unable to complete the breathing treatment.

## 2015-06-03 NOTE — ED Notes (Signed)
Patient to PMD today was given a steroid and antibiotic shot  - the patient reports that she is trying to eat and is nauseated. The patient is also coughing with wheezing. Patient reports that she is has stopped eating solid foods and now she is trying clear liquids and feels nauseated.

## 2015-06-03 NOTE — ED Notes (Signed)
Pt provided PO fluids at this time for PO challenge.

## 2015-06-03 NOTE — ED Provider Notes (Addendum)
CSN: [id number]     Arrival date & time 06/03/15  2030 History  This chart was scribed for Gwyneth Sprout, MD by Gwenyth Ober, ED Scribe. This patient was seen in room MH03/MH03 and the patient's care was started at 9:43 PM.    Chief Complaint  Patient presents with  . Cough   The history is provided by the patient. No language interpreter was used.    HPI Comments: Ebony Hansen is a 34 y.o. female who presents to the Emergency Department complaining of intermittent, moderate cough that started yesterday and became worse tonight. She states generalized body aches, nausea, wheezing and post-tussive vomiting as associated symptoms. Pt has tried her inhaler and a breathing treatment administered in the ED with some relief. Pt was seen by her PCP earlier today for the same and had a normal a chest x-ray. They administered steroids and an antibiotic in their office. Pt had her flu shot last week. She notes sick contact, with similar symptoms, at her school. Pt's LMP started 10 days ago. She denies smoking. Pt also denies dysuria as an associated symptom.   Past Medical History  Diagnosis Date  . Sickle cell trait   . Chlamydia   . Anxiety   . ADHD (attention deficit hyperactivity disorder)    Past Surgical History  Procedure Laterality Date  . Cesarean section     History reviewed. No pertinent family history. Social History  Substance Use Topics  . Smoking status: Never Smoker   . Smokeless tobacco: None  . Alcohol Use: No   OB History    Gravida Para Term Preterm AB TAB SAB Ectopic Multiple Living   Review of Systems  Respiratory: Positive for cough and wheezing.   Gastrointestinal: Positive for nausea, vomiting (post-tussive) and abdominal pain.  Genitourinary: Negative for dysuria.  Musculoskeletal: Positive for myalgias.  All other systems reviewed and are negative.     Allergies  Sulfa drugs cross reactors  Home Medications   Prior to  Admission medications   Medication Sig Start Date End Date Taking? Authorizing Provider  benzonatate (TESSALON) 100 MG capsule Take 100 mg by mouth 3 (three) times daily as needed for cough.   Yes Historical Provider, MD  cefdinir (OMNICEF) 300 MG capsule Take 300 mg by mouth 2 (two) times daily.   Yes Historical Provider, MD  atomoxetine (STRATTERA) 10 MG capsule Take 10 mg by mouth daily.    Historical Provider, MD  citalopram (CELEXA) 20 MG tablet Take 20 mg by mouth daily.    Historical Provider, MD  traMADol (ULTRAM) 50 MG tablet Take 50 mg by mouth daily as needed (For back pain.).    Historical Provider, MD   BP 134/85 mmHg  Pulse 2  Temp(Src) 99 F (37.2 C) (Oral)  Resp 16  Ht  (1.6 m)  Wt 142 lb (64.411 kg)  BMI 25.16 kg/m2  SpO2 100%  LMP 05/24/2015 Physical Exam  Constitutional: She appears well-developed and well-nourished. No distress.  HENT:  Head: Normocephalic and atraumatic.  Right Ear: External ear normal.  Left Ear: External ear normal.  Mouth/Throat: Oropharynx is clear and moist. No oropharyngeal exudate.  Eyes: Conjunctivae and EOM are normal.  Neck: Neck supple. No tracheal deviation present.  Cardiovascular: Normal rate, regular rhythm and normal heart sounds.  Exam reveals no gallop and no friction rub.   No murmur heard. Pulmonary/Chest: Effort normal and breath sounds normal.  No respiratory distress. She has no wheezes. She has no rales.  Abdominal: Soft. There is tenderness. There is no rebound and no guarding.  Mild generalized abdominal tenderness  Lymphadenopathy:    She has no cervical adenopathy.  Skin: Skin is warm and dry.  Psychiatric: She has a normal mood and affect. Her behavior is normal.  Nursing note and vitals reviewed.   ED Course  Procedures   DIAGNOSTIC STUDIES: Oxygen Saturation is 100% on RA, normal by my interpretation.    COORDINATION OF CARE: 9:51 PM Discussed treatment plan with pt which includes IV fluids and  nausea medication. Pt agreed to plan.   Labs Review Labs Reviewed - No data to display  Imaging Review Dg Chest 2 View  06/03/2015   CLINICAL DATA:  Shortness of breath cough headache body aches wheezing since last night  EXAM: CHEST  2 VIEW  COMPARISON:  01/13/2009  FINDINGS: The heart size and mediastinal contours are within normal limits. Both lungs are clear. The visualized skeletal structures are unremarkable.  IMPRESSION: No active cardiopulmonary disease.   Electronically Signed   By: Esperanza Heir M.D.   On: 06/03/2015 21:20   I have personally reviewed and evaluated these images as part of my medical decision-making.   EKG Interpretation None      MDM   Final diagnoses:  URI (upper respiratory infection)  Dehydration  Non-intractable vomiting with nausea, vomiting of unspecified type    Pt with symptoms consistent with viral URI starting yesterday.  Patient has had continued vomiting today and is unable to hold down anything. She was seen by her PCP earlier today and given a shot of steroids and antibiotics. She has steroids and antibiotics at home however because of continued vomiting she came here for further care.  No signs of breathing difficulty  No signs of pharyngitis, otitis or abnormal abdominal findings.   CXR wnl.  We will give IV fluids and nausea medication. On repeat evaluation patient still vomiting so gave Phenergan.  I personally performed the services described in this documentation, which was scribed in my presence.  The recorded information has been reviewed and considered.    Gwyneth Sprout, MD 06/03/15 1610  Gwyneth Sprout, MD 06/03/15 [id number]

## 2015-06-03 NOTE — Discharge Instructions (Signed)

## 2015-06-04 NOTE — ED Notes (Signed)
Pt attempting another PO challenge.

## 2016-08-11 ENCOUNTER — Inpatient Hospital Stay (HOSPITAL_COMMUNITY)
Admission: AD | Admit: 2016-08-11 | Discharge: 2016-08-12 | Disposition: A | Discharge status: Home / Self Care | Payer: BLUE CROSS/BLUE SHIELD | Source: Ambulatory Visit | Attending: Obstetrics and Gynecology | Admitting: Obstetrics and Gynecology

## 2016-08-11 DIAGNOSIS — R197 Diarrhea, unspecified: Secondary | ICD-10-CM | POA: Diagnosis not present

## 2016-08-11 DIAGNOSIS — O9989 Other specified diseases and conditions complicating pregnancy, childbirth and the puerperium: Secondary | ICD-10-CM | POA: Insufficient documentation

## 2016-08-11 DIAGNOSIS — R109 Unspecified abdominal pain: Secondary | ICD-10-CM | POA: Diagnosis not present

## 2016-08-11 DIAGNOSIS — Z888 Allergy status to other drugs, medicaments and biological substances status: Secondary | ICD-10-CM | POA: Insufficient documentation

## 2016-08-11 DIAGNOSIS — Z3A01 Less than 8 weeks gestation of pregnancy: Secondary | ICD-10-CM | POA: Diagnosis present

## 2016-08-11 DIAGNOSIS — O26891 Other specified pregnancy related conditions, first trimester: Secondary | ICD-10-CM | POA: Insufficient documentation

## 2016-08-11 DIAGNOSIS — R1032 Left lower quadrant pain: Secondary | ICD-10-CM

## 2016-08-11 DIAGNOSIS — Z79899 Other long term (current) drug therapy: Secondary | ICD-10-CM | POA: Insufficient documentation

## 2016-08-11 DIAGNOSIS — O99341 Other mental disorders complicating pregnancy, first trimester: Secondary | ICD-10-CM | POA: Insufficient documentation

## 2016-08-11 DIAGNOSIS — F419 Anxiety disorder, unspecified: Secondary | ICD-10-CM | POA: Insufficient documentation

## 2016-08-11 DIAGNOSIS — Z9889 Other specified postprocedural states: Secondary | ICD-10-CM | POA: Diagnosis not present

## 2016-08-11 DIAGNOSIS — F909 Attention-deficit hyperactivity disorder, unspecified type: Secondary | ICD-10-CM | POA: Diagnosis not present

## 2016-08-11 DIAGNOSIS — D573 Sickle-cell trait: Secondary | ICD-10-CM | POA: Insufficient documentation

## 2016-08-11 DIAGNOSIS — O009 Unspecified ectopic pregnancy without intrauterine pregnancy: Secondary | ICD-10-CM

## 2016-08-11 LAB — URINALYSIS, ROUTINE W REFLEX MICROSCOPIC
BILIRUBIN URINE: NEGATIVE
Glucose, UA: NEGATIVE mg/dL
Hgb urine dipstick: NEGATIVE
KETONES UR: 15 mg/dL — AB
LEUKOCYTES UA: NEGATIVE
NITRITE: NEGATIVE
PH: 6 (ref 5.0–8.0)
PROTEIN: NEGATIVE mg/dL
Specific Gravity, Urine: 1.02 (ref 1.005–1.030)

## 2016-08-11 LAB — WET PREP, GENITAL
Sperm: NONE SEEN
TRICH WET PREP: NONE SEEN
YEAST WET PREP: NONE SEEN

## 2016-08-11 LAB — POCT PREGNANCY, URINE: PREG TEST UR: POSITIVE — AB

## 2016-08-11 NOTE — MAU Provider Note (Signed)
History     CSN: [id number]  Arrival date and time: 08/11/16 2309   First Provider Initiated Contact with Patient 08/11/16 2329      Chief Complaint  Patient presents with  . Possible Pregnancy  . Abdominal Pain  . Vaginal Bleeding   Diarrhea Patient complains of diarrhea. Onset of diarrhea was 4 days ago. Diarrhea is occurring approximately 2 times per day. Patient describes diarrhea as watery. Diarrhea has been associated with abdominal pain described as aching. Patient denies blood in stool, fever, illness in household contacts, recent antibiotic use, recent camping.  Evaluation to date: none. Treatment to date: none.    Abdominal Pain  This is a new problem. The current episode started yesterday. The onset quality is sudden. The problem occurs constantly. The problem has been unchanged. The pain is located in the LLQ. The pain is at a severity of 6/10. The quality of the pain is cramping. The abdominal pain radiates to the back. Associated symptoms include diarrhea, nausea and vomiting. Pertinent negatives include no anorexia, arthralgias, belching, constipation, dysuria, fever, flatus, frequency, headaches, hematochezia, hematuria, melena or myalgias. The pain is aggravated by movement. The pain is relieved by being still. She has tried nothing for the symptoms. Her past medical history is significant for abdominal surgery. There is no history of colon cancer, Crohn's disease, gallstones, GERD, irritable bowel syndrome, pancreatitis, PUD or ulcerative colitis.    OB History    Gravida Para Term Preterm AB Living   5 2 2   3 2    SAB TAB Ectopic Multiple Live Births   2 1            Past Medical History:  Diagnosis Date  . ADHD (attention deficit hyperactivity disorder)   . Anxiety   . Chlamydia   . Sickle cell trait     Past Surgical History:  Procedure Laterality Date  . CESAREAN SECTION      No family history on file.  Social History  Substance Use Topics  .  Smoking status: Never Smoker  . Smokeless tobacco: Not on file  . Alcohol use No    Allergies:  Allergies  Allergen Reactions  . Sulfa Drugs Cross Reactors Anaphylaxis and Other (See Comments)    Causes drooling.    Prescriptions Prior to Admission  Medication Sig Dispense Refill Last Dose  . amphetamine-dextroamphetamine (ADDERALL XR) 25 MG 24 hr capsule Take 25 mg by mouth every morning.   Past Week at Unknown time  . traMADol (ULTRAM) 50 MG tablet Take 50 mg by mouth daily as needed (For back pain.).   Past Week at Unknown time  . atomoxetine (STRATTERA) 10 MG capsule Take 10 mg by mouth daily.     . benzonatate (TESSALON) 100 MG capsule Take 100 mg by mouth 3 (three) times daily as needed for cough.   06/03/2015 at Unknown time  . cefdinir (OMNICEF) 300 MG capsule Take 300 mg by mouth 2 (two) times daily.   06/03/2015 at Unknown time  . citalopram (CELEXA) 20 MG tablet Take 20 mg by mouth daily.   09/04/2013 at Unknown time  . promethazine (PHENERGAN) 25 MG tablet Take 1 tablet (25 mg total) by mouth every 6 (six) hours as needed for nausea or vomiting. 15 tablet 0     Review of Systems  Constitutional: Negative for fever.  HENT: Negative.   Eyes: Negative.   Respiratory: Negative.   Cardiovascular: Negative.   Gastrointestinal: Positive for abdominal pain, diarrhea, nausea and  vomiting. Negative for anorexia, constipation, flatus, hematochezia and melena.  Genitourinary: Negative for dysuria, frequency and hematuria.  Musculoskeletal: Negative for arthralgias and myalgias.  Skin: Negative.   Neurological: Negative for headaches.  Endo/Heme/Allergies: Negative.    Physical Exam   Blood pressure 122/73, pulse 78, temperature 98.5 F (36.9 C), temperature source Oral, resp. rate 16, height 5\' 3"  (1.6 m), weight 142 lb (64.4 kg), last menstrual period 07/04/2016.  Physical Exam  Constitutional: She is oriented to person, place, and time. She appears well-developed and  well-nourished.  HENT:  Head: Normocephalic.  Neck: Normal range of motion.  Respiratory: Effort normal. No respiratory distress. She has no wheezes. She has no rales. She exhibits no tenderness.  GI: Soft. She exhibits no distension and no mass. There is tenderness. There is guarding. There is no rebound.  Tender to palpation over left lower quadrant.   Genitourinary: Vagina normal and uterus normal. No vaginal discharge found.  Genitourinary Comments: Patient's external genitalia is normal-appearing with no lesions or discharge. Vaginal walls are pink and moist with no erythema; no lesions. Cervix is pink with no discharge or lesions or erythema. Multiple non-bleeding non-strangulated hemorroids present. On bimanual exam she is tender to palpation over the left lower quadrant.   Musculoskeletal: Normal range of motion.  Neurological: She is alert and oriented to person, place, and time.  Skin: Skin is warm and dry.  Psychiatric: She has a normal mood and affect.    MAU Course  Procedures  MDM -CBC/CMP-white count 8.2; H and H 11/32 -BETA of 3900 -US-positive IUP -ABO Typing -UA-15 ketones by urine specific gravity is 1.020 -wet prep and GC CT due to hx of chlamydia. Wet prep shows positive clue cells.  -Phenergan IM for nausea   Assessment and Plan   Patient Ebony Hansen is a [id number] here with complaints of abdominal pain since 11/21 and diarrhea since 11/20. Has had two watery stools today and two watery stools yesterday. Patient denies fever, chills, headache. Had a small amount of spotting on her underwear yesterday but none since.  Her US showed a SIUP with gestational age of [redacted] weeks and 2 days; gest sac and yolk sac visualized.   Patient is stable for discharge and feels better after IM phenergan.Hydration and BRAT diet recommendations given. Patient given prescription for flagyl but instructed not to take until her diarrhea and nausea have resolved; she is currently  asymptomatic for bacterial vaginosis.  Prescription for phenergen given. Patient to follow up with her ob-gyn as needed. Patient urged to discuss her medication needs with her ob-gyn, given that she has recently taken herself off of her ADHD medicines. She plans to keep her appointment on December 11.   Charlesetta GaribaldiKathryn Lorraine Kooistra CNM 08/11/2016, 11:54 PM

## 2016-08-12 ENCOUNTER — Inpatient Hospital Stay (HOSPITAL_COMMUNITY): Payer: BLUE CROSS/BLUE SHIELD

## 2016-08-12 DIAGNOSIS — R1032 Left lower quadrant pain: Secondary | ICD-10-CM | POA: Diagnosis not present

## 2016-08-12 LAB — CBC WITH DIFFERENTIAL/PLATELET
BASOS ABS: 0 10*3/uL (ref 0.0–0.1)
BASOS PCT: 1 %
EOS ABS: 0.2 10*3/uL (ref 0.0–0.7)
Eosinophils Relative: 2 %
HEMATOCRIT: 32 % — AB (ref 36.0–46.0)
HEMOGLOBIN: 11 g/dL — AB (ref 12.0–15.0)
Lymphocytes Relative: 38 %
Lymphs Abs: 3.1 10*3/uL (ref 0.7–4.0)
MCH: 25.9 pg — ABNORMAL LOW (ref 26.0–34.0)
MCHC: 34.4 g/dL (ref 30.0–36.0)
MCV: 75.5 fL — ABNORMAL LOW (ref 78.0–100.0)
Monocytes Absolute: 0.2 10*3/uL (ref 0.1–1.0)
Monocytes Relative: 3 %
NEUTROS ABS: 4.7 10*3/uL (ref 1.7–7.7)
NEUTROS PCT: 56 %
Platelets: 232 10*3/uL (ref 150–400)
RBC: 4.24 MIL/uL (ref 3.87–5.11)
RDW: 17.3 % — ABNORMAL HIGH (ref 11.5–15.5)
WBC: 8.2 10*3/uL (ref 4.0–10.5)

## 2016-08-12 LAB — COMPREHENSIVE METABOLIC PANEL
ALT: 10 U/L — AB (ref 14–54)
AST: 12 U/L — AB (ref 15–41)
Albumin: 3.7 g/dL (ref 3.5–5.0)
Alkaline Phosphatase: 38 U/L (ref 38–126)
Anion gap: 5 (ref 5–15)
BUN: 5 mg/dL — AB (ref 6–20)
CHLORIDE: 107 mmol/L (ref 101–111)
CO2: 26 mmol/L (ref 22–32)
CREATININE: 0.67 mg/dL (ref 0.44–1.00)
Calcium: 8.9 mg/dL (ref 8.9–10.3)
GFR calc Af Amer: >60 mL/min (ref >60)
GFR calc non Af Amer: >60 mL/min (ref >60)
Glucose, Bld: 102 mg/dL — ABNORMAL HIGH (ref 65–99)
POTASSIUM: 3.6 mmol/L (ref 3.5–5.1)
SODIUM: 138 mmol/L (ref 135–145)
Total Bilirubin: 0.4 mg/dL (ref 0.3–1.2)
Total Protein: 6.8 g/dL (ref 6.5–8.1)

## 2016-08-12 LAB — ABO/RH: ABO/RH(D): A POS

## 2016-08-12 LAB — HCG, QUANTITATIVE, PREGNANCY: HCG, BETA CHAIN, QUANT, S: 3907 m[IU]/mL — AB (ref <5)

## 2016-08-12 MED ORDER — METRONIDAZOLE 500 MG PO TABS: 500.0000 mg | ORAL_TABLET | Freq: Three times a day (TID) | ORAL | 0 refills | Status: AC

## 2016-08-12 MED ORDER — LOPERAMIDE HCL 2 MG PO CAPS: 2.0000 mg | ORAL_CAPSULE | ORAL | Status: DC | PRN

## 2016-08-12 MED ORDER — PROMETHAZINE HCL 25 MG/ML IJ SOLN
25.0000 mg | Freq: Four times a day (QID) | INTRAMUSCULAR | Status: DC | PRN
  Administered 2016-08-12: 25 mg via INTRAMUSCULAR
  Filled 2016-08-12: qty 1

## 2016-08-12 MED ORDER — PROMETHAZINE HCL 25 MG PO TABS: 25.0000 mg | ORAL_TABLET | Freq: Four times a day (QID) | ORAL | 1 refills | Status: DC | PRN

## 2016-08-12 NOTE — Discharge Instructions (Signed)

## 2016-08-13 LAB — GC/CHLAMYDIA PROBE AMP (~~LOC~~) NOT AT ARMC
CHLAMYDIA, DNA PROBE: NEGATIVE
NEISSERIA GONORRHEA: NEGATIVE

## 2016-08-13 NOTE — MAU Note (Addendum)
Pt was discharged and a pain assessment was done by K. Janann AugustWeiss RN at 0140 on 08/12/16. Pain assessment at that time was 0/10. The discharge order was cancelled which negated the pain assessment. IT informed RN that a pain assessment needed to be charted in order for the pt to be discharged. RN charted the same pain assessment from 0140 on 08/12/16 in order to discharge pt.

## 2016-08-17 DIAGNOSIS — Z3A36 36 weeks gestation of pregnancy: Secondary | ICD-10-CM | POA: Insufficient documentation

## 2016-08-17 DIAGNOSIS — K439 Ventral hernia without obstruction or gangrene: Secondary | ICD-10-CM | POA: Insufficient documentation

## 2016-08-18 ENCOUNTER — Encounter (HOSPITAL_COMMUNITY): Payer: Self-pay | Admitting: *Deleted

## 2016-08-18 ENCOUNTER — Inpatient Hospital Stay (HOSPITAL_COMMUNITY)
Admission: AD | Admit: 2016-08-18 | Discharge: 2016-08-18 | Disposition: A | Discharge status: Home / Self Care | Payer: BLUE CROSS/BLUE SHIELD | Source: Ambulatory Visit | Attending: Obstetrics & Gynecology | Admitting: Obstetrics & Gynecology

## 2016-08-18 ENCOUNTER — Inpatient Hospital Stay (HOSPITAL_COMMUNITY): Payer: BLUE CROSS/BLUE SHIELD

## 2016-08-18 DIAGNOSIS — Z8619 Personal history of other infectious and parasitic diseases: Secondary | ICD-10-CM | POA: Insufficient documentation

## 2016-08-18 DIAGNOSIS — F909 Attention-deficit hyperactivity disorder, unspecified type: Secondary | ICD-10-CM | POA: Insufficient documentation

## 2016-08-18 DIAGNOSIS — O208 Other hemorrhage in early pregnancy: Secondary | ICD-10-CM | POA: Diagnosis not present

## 2016-08-18 DIAGNOSIS — O26891 Other specified pregnancy related conditions, first trimester: Secondary | ICD-10-CM

## 2016-08-18 DIAGNOSIS — F419 Anxiety disorder, unspecified: Secondary | ICD-10-CM | POA: Insufficient documentation

## 2016-08-18 DIAGNOSIS — O468X1 Other antepartum hemorrhage, first trimester: Secondary | ICD-10-CM

## 2016-08-18 DIAGNOSIS — O200 Threatened abortion: Secondary | ICD-10-CM | POA: Diagnosis present

## 2016-08-18 DIAGNOSIS — Z3A01 Less than 8 weeks gestation of pregnancy: Secondary | ICD-10-CM | POA: Diagnosis not present

## 2016-08-18 DIAGNOSIS — Z882 Allergy status to sulfonamides status: Secondary | ICD-10-CM | POA: Diagnosis not present

## 2016-08-18 DIAGNOSIS — O418X10 Other specified disorders of amniotic fluid and membranes, first trimester, not applicable or unspecified: Secondary | ICD-10-CM

## 2016-08-18 DIAGNOSIS — D573 Sickle-cell trait: Secondary | ICD-10-CM | POA: Insufficient documentation

## 2016-08-18 DIAGNOSIS — R102 Pelvic and perineal pain: Secondary | ICD-10-CM

## 2016-08-18 LAB — URINALYSIS, ROUTINE W REFLEX MICROSCOPIC
BILIRUBIN URINE: NEGATIVE
GLUCOSE, UA: NEGATIVE mg/dL
KETONES UR: NEGATIVE mg/dL
NITRITE: NEGATIVE
PH: 6.5 (ref 5.0–8.0)
Protein, ur: NEGATIVE mg/dL
Specific Gravity, Urine: <1.005 — ABNORMAL LOW (ref 1.005–1.030)

## 2016-08-18 LAB — URINE MICROSCOPIC-ADD ON: Bacteria, UA: NONE SEEN

## 2016-08-18 LAB — CBC
HCT: 34.9 % — ABNORMAL LOW (ref 36.0–46.0)
HEMOGLOBIN: 11.8 g/dL — AB (ref 12.0–15.0)
MCH: 25.8 pg — AB (ref 26.0–34.0)
MCHC: 33.8 g/dL (ref 30.0–36.0)
MCV: 76.2 fL — AB (ref 78.0–100.0)
PLATELETS: 285 10*3/uL (ref 150–400)
RBC: 4.58 MIL/uL (ref 3.87–5.11)
RDW: 17.7 % — ABNORMAL HIGH (ref 11.5–15.5)
WBC: 9.4 10*3/uL (ref 4.0–10.5)

## 2016-08-18 LAB — HCG, QUANTITATIVE, PREGNANCY: HCG, BETA CHAIN, QUANT, S: 31825 m[IU]/mL — AB (ref <5)

## 2016-08-18 NOTE — MAU Provider Note (Signed)
Chief Complaint: Abdominal Pain and Vaginal Bleeding   First Provider Initiated Contact with Patient 08/18/16 1820      SUBJECTIVE HPI: Ebony Hansen is a 35 y.o. [id number] at 608w3d by LMP who presents to maternity admissions reporting pain starting 1 week ago, worsening today.  She also reports onset of brown bleeding when wiping today.  She has not tried any treatments, nothing makes the bleeding or pain better or worse.  The pain and bleeding are gradually worsening.  She has not started prenatal care but has appt with [facility] for her new OB visit.  Then, she plans to transfer to [facility] during the pregnancy and delivery in [place].  She denies vaginal itching/burning, urinary symptoms, h/a, dizziness, n/v, or fever/chills.     HPI  Past Medical History:  Diagnosis Date  . ADHD (attention deficit hyperactivity disorder)   . Anxiety   . Chlamydia   . Sickle cell trait [facility](HCC)    Past Surgical History:  Procedure Laterality Date  . CESAREAN SECTION    . WISDOM TOOTH EXTRACTION     Social History   Social History  . Marital status: Married    Spouse name: N/A  . Number of children: N/A  . Years of education: N/A   Occupational History  . Not on file.   Social History Main Topics  . Smoking status: Never Smoker  . Smokeless tobacco: Never Used  . Alcohol use No  . Drug use: No  . Sexual activity: Yes    Birth control/ protection: None   Other Topics Concern  . Not on file   Social History Narrative  . No narrative on file   No current facility-administered medications on file prior to encounter.    Current Outpatient Prescriptions on File Prior to Encounter  Medication Sig Dispense Refill  . metroNIDAZOLE (FLAGYL) 500 MG tablet Take 1 tablet (500 mg total) by mouth 3 (three) times daily. 21 tablet 0  . promethazine (PHENERGAN) 25 MG tablet Take 1 tablet (25 mg total) by mouth every 6 (six) hours as needed for nausea or vomiting. 15 tablet 0  .  promethazine (PHENERGAN) 25 MG tablet Take 1 tablet (25 mg total) by mouth every 6 (six) hours as needed for nausea or vomiting. (Patient not taking: Reported on 08/18/2016) 30 tablet 1   Allergies  Allergen Reactions  . Sulfa Drugs Cross Reactors Anaphylaxis and Other (See Comments)    Causes drooling.    ROS:  Review of Systems  Constitutional: Negative for chills, fatigue and fever.  Respiratory: Negative for shortness of breath.   Cardiovascular: Negative for chest pain.  Gastrointestinal: Positive for abdominal pain.  Genitourinary: Positive for pelvic pain and vaginal bleeding. Negative for difficulty urinating, dysuria, flank pain, vaginal discharge and vaginal pain.  Neurological: Negative for dizziness and headaches.  Psychiatric/Behavioral: Negative.      I have reviewed patient's Past Medical Hx, Surgical Hx, Family Hx, Social Hx, medications and allergies.   Physical Exam   Patient Vitals for the past 24 hrs:  BP Temp Pulse Resp Weight  08/18/16 1636 108/65 98.1 F (36.7 C) 85 16 148 lb 3.2 oz (67.2 kg)   Constitutional: Well-developed, well-nourished female in no acute distress.  Cardiovascular: normal rate Respiratory: normal effort GI: Abd soft, non-tender. Pos BS x 4 MS: Extremities nontender, no edema, normal ROM Neurologic: Alert and oriented x 4.  GU: Neg CVAT.  PELVIC EXAM: Cervix pink, visually closed, without lesion, moderate amount dark brown  bleeding, vaginal walls and external genitalia normal Bimanual exam: Cervix 0/long/high, firm, anterior, neg CMT, uterus nontender, slightly enlarged, adnexa without tenderness, enlargement, or mass   LAB RESULTS Results for orders placed or performed during the hospital encounter of 08/18/16 (from the past 24 hour(s))  Urinalysis, Routine w reflex microscopic (not at [facility])     Status: Abnormal   Collection Time: 08/18/16  4:46 PM  Result Value Ref Range   Color, Urine YELLOW YELLOW   APPearance HAZY (A)  CLEAR   Specific Gravity, Urine <1.005 (L) 1.005 - 1.030   pH 6.5 5.0 - 8.0   Glucose, UA NEGATIVE NEGATIVE mg/dL   Hgb urine dipstick LARGE (A) NEGATIVE   Bilirubin Urine NEGATIVE NEGATIVE   Ketones, ur NEGATIVE NEGATIVE mg/dL   Protein, ur NEGATIVE NEGATIVE mg/dL   Nitrite NEGATIVE NEGATIVE   Leukocytes, UA SMALL (A) NEGATIVE  Urine microscopic-add on     Status: Abnormal   Collection Time: 08/18/16  4:46 PM  Result Value Ref Range   Squamous Epithelial / LPF 6-30 (A) NONE SEEN   WBC, UA 0-5 0 - 5 WBC/hpf   RBC / HPF 0-5 0 - 5 RBC/hpf   Bacteria, UA NONE SEEN NONE SEEN  CBC     Status: Abnormal   Collection Time: 08/18/16  6:14 PM  Result Value Ref Range   WBC 9.4 4.0 - 10.5 K/uL   RBC 4.58 3.87 - 5.11 MIL/uL   Hemoglobin 11.8 (L) 12.0 - 15.0 g/dL   HCT 16.1 (L) 09.6 - 04.5 %   MCV 76.2 (L) 78.0 - 100.0 fL   MCH 25.8 (L) 26.0 - 34.0 pg   MCHC 33.8 30.0 - 36.0 g/dL   RDW 40.9 (H) 81.1 - 91.4 %   Platelets 285 150 - 400 K/uL  hCG, quantitative, pregnancy     Status: Abnormal   Collection Time: 08/18/16  6:14 PM  Result Value Ref Range   hCG, Beta Chain, Quant, S 31,825 (H) <5 mIU/mL    --/--/A POS (11/23 0002)  IMAGING US Ob Comp Less 14 Wks  Result Date: 08/12/2016 CLINICAL DATA:  Acute onset of severe lower abdominal cramping and vaginal spotting. Initial encounter. EXAM: OBSTETRIC <14 WK [place] AND TRANSVAGINAL OB US TECHNIQUE: Both transabdominal and transvaginal ultrasound examinations were performed for complete evaluation of the gestation as well as the maternal uterus, adnexal regions, and pelvic cul-de-sac. Transvaginal technique was performed to assess early pregnancy. COMPARISON:  Pelvic ultrasound performed 10/27/2008 FINDINGS: Intrauterine gestational sac: Single; visualized and normal in shape. Yolk sac:  Yes Embryo:  No Cardiac Activity: N/A MSD: 6.9  mm   5 w   2  d Subchorionic hemorrhage:  None visualized. Maternal uterus/adnexae: The uterus is otherwise  unremarkable. The ovaries are within normal limits. The right ovary measures with 3.5 x 1.8 x 3.4 cm, while the left ovary measures 3.3 x 1.4 x 2.7 cm. No suspicious adnexal masses are seen; there is no evidence for ovarian torsion. Trace free fluid is noted within the pelvic cul-de-sac. IMPRESSION: Single intrauterine gestational sac noted, with a mean sac diameter of 7 mm, corresponding to a gestational age of [redacted] weeks 2 days. This matches the gestational age of [redacted] weeks 3 days by LMP, reflecting an estimated date of delivery of April 10, 2017. No embryo yet seen, within normal limits. A yolk sac is visualized. Electronically Signed   By: Roanna Raider M.D.   On: 08/12/2016 00:43   US Ob Transvaginal  Result Date: 08/18/2016 CLINICAL DATA:  Pelvic pain. Spotting and cramping today. Gestational age by last menstrual period 6 weeks and 3 days. EXAM: TRANSVAGINAL OB ULTRASOUND TECHNIQUE: Transvaginal ultrasound was performed for complete evaluation of the gestation as well as the maternal uterus, adnexal regions, and pelvic cul-de-sac. COMPARISON:  Pelvic ultrasound August 12, 2016 FINDINGS: Intrauterine gestational sac: Present Yolk sac:  Present Embryo:  Present Cardiac Activity: Present Heart Rate: 99 bpm CRL:   History  mm   5 w 5 d                  [place] EDC: April 15, 2017 Subchorionic hemorrhage:  Small subchorionic hemorrhage. Maternal uterus/adnexae: 1.6 cm RIGHT corpus luteal cyst. Trace free fluid in pelvic cul-de-sac. IMPRESSION: Single live intrauterine pregnancy, gestational age by ultrasound 5 weeks and 5 days. Small subchorionic hemorrhage. Electronically Signed   By: Awilda Metro M.D.   On: 08/18/2016 18:56   US Ob Transvaginal  Result Date: 08/12/2016 CLINICAL DATA:  Acute onset of severe lower abdominal cramping and vaginal spotting. Initial encounter. EXAM: OBSTETRIC <14 WK [place] AND TRANSVAGINAL OB US TECHNIQUE: Both transabdominal and transvaginal ultrasound examinations were performed  for complete evaluation of the gestation as well as the maternal uterus, adnexal regions, and pelvic cul-de-sac. Transvaginal technique was performed to assess early pregnancy. COMPARISON:  Pelvic ultrasound performed 10/27/2008 FINDINGS: Intrauterine gestational sac: Single; visualized and normal in shape. Yolk sac:  Yes Embryo:  No Cardiac Activity: N/A MSD: 6.9  mm   5 w   2  d Subchorionic hemorrhage:  None visualized. Maternal uterus/adnexae: The uterus is otherwise unremarkable. The ovaries are within normal limits. The right ovary measures with 3.5 x 1.8 x 3.4 cm, while the left ovary measures 3.3 x 1.4 x 2.7 cm. No suspicious adnexal masses are seen; there is no evidence for ovarian torsion. Trace free fluid is noted within the pelvic cul-de-sac. IMPRESSION: Single intrauterine gestational sac noted, with a mean sac diameter of 7 mm, corresponding to a gestational age of [redacted] weeks 2 days. This matches the gestational age of [redacted] weeks 3 days by LMP, reflecting an estimated date of delivery of April 10, 2017. No embryo yet seen, within normal limits. A yolk sac is visualized. Electronically Signed   By: Roanna Raider M.D.   On: 08/12/2016 00:43    MAU Management/MDM: Ordered labs and [place] and reviewed results.  Discussed threatened miscarriage with small [facility] with pt.  Pt to f/u with prenatal care as scheduled. Pelvic rest while bleeding.  Bleeding precautions/reasons to return to MAU reviewed. Pt stable at time of discharge.  ASSESSMENT 1. Threatened miscarriage in early pregnancy   2. Pelvic pain affecting pregnancy in first trimester, antepartum   3. Subchorionic hemorrhage of placenta in first trimester, single or unspecified fetus     PLAN Discharge home with bleeding precautions   Medication List    TAKE these medications   albuterol 108 (90 Base) MCG/ACT inhaler Commonly known as:  PROVENTIL HFA;VENTOLIN HFA Inhale 2 puffs into the lungs every 6 (six) hours as needed for wheezing or  shortness of breath.   metroNIDAZOLE 500 MG tablet Commonly known as:  FLAGYL Take 1 tablet (500 mg total) by mouth 3 (three) times daily.   prenatal multivitamin Tabs tablet Take 1 tablet by mouth daily at 12 noon.   promethazine 25 MG tablet Commonly known as:  PHENERGAN Take 1 tablet (25 mg total) by mouth every 6 (six) hours as needed for  nausea or vomiting. What changed:  Another medication with the same name was removed. Continue taking this medication, and follow the directions you see here.      Follow-up Information    [facility] Follow up.   Why:  As scheduled, return to MAU as needed for emergencies          Sharen CounterLisa Leftwich-Kirby Certified Nurse-Midwife 08/18/2016  7:36 PM

## 2016-08-18 NOTE — Discharge Instructions (Signed)
Threatened Miscarriage A threatened miscarriage occurs when you have vaginal bleeding during your first 20 weeks of pregnancy but the pregnancy has not ended. If you have vaginal bleeding during this time, your health care provider will do tests to make sure you are still pregnant. If the tests show you are still pregnant and the developing baby (fetus) inside your womb (uterus) is still growing, your condition is considered a threatened miscarriage. A threatened miscarriage does not mean your pregnancy will end, but it does increase the risk of losing your pregnancy (complete miscarriage). What are the causes? The cause of a threatened miscarriage is usually not known. If you go on to have a complete miscarriage, the most common cause is an abnormal number of chromosomes in the developing baby. Chromosomes are the structures inside cells that hold all your genetic material. Some causes of vaginal bleeding that do not result in miscarriage include:  Having sex.  Having an infection.  Normal hormone changes of pregnancy.  Bleeding that occurs when an egg implants in your uterus. What increases the risk? Risk factors for bleeding in early pregnancy include:  Obesity.  Smoking.  Drinking excessive amounts of alcohol or caffeine.  Recreational drug use. What are the signs or symptoms?  Light vaginal bleeding.  Mild abdominal pain or cramps. How is this diagnosed? If you have bleeding with or without abdominal pain before 20 weeks of pregnancy, your health care provider will do tests to check whether you are still pregnant. One important test involves using sound waves and a computer (ultrasound) to create images of the inside of your uterus. Other tests include an internal exam of your vagina and uterus (pelvic exam) and measurement of your babys heart rate. You may be diagnosed with a threatened miscarriage if:  Ultrasound testing shows you are still pregnant.  Your babys heart rate  is strong.  A pelvic exam shows that the opening between your uterus and your vagina (cervix) is closed.  Your heart rate and blood pressure are stable.  Blood tests confirm you are still pregnant. How is this treated? No treatments have been shown to prevent a threatened miscarriage from going on to a complete miscarriage. However, the right home care is important. Follow these instructions at home:  Make sure you keep all your appointments for prenatal care. This is very important.  Get plenty of rest.  Do not have sex or use tampons if you have vaginal bleeding.  Do not douche.  Do not smoke or use recreational drugs.  Do not drink alcohol.  Avoid caffeine. Contact a health care provider if:  You have light vaginal bleeding or spotting while pregnant.  You have abdominal pain or cramping.  You have a fever. Get help right away if:  You have heavy vaginal bleeding.  You have blood clots coming from your vagina.  You have severe low back pain or abdominal cramps.  You have fever, chills, and severe abdominal pain. This information is not intended to replace advice given to you by your health care provider. Make sure you discuss any questions you have with your health care provider. Document Released: 09/06/2005 Document Revised: 02/12/2016 Document Reviewed: 07/03/2013 Elsevier Interactive Patient Education  2017 Elsevier Inc.  Subchorionic Hematoma A subchorionic hematoma is a gathering of blood between the outer wall of the placenta and the inner wall of the womb (uterus). The placenta is the organ that connects the fetus to the wall of the uterus. The placenta performs the feeding,  breathing (oxygen to the fetus), and waste removal (excretory work) of the fetus.  Subchorionic hematoma is the most common abnormality found on a result from ultrasonography done during the first trimester or early second trimester of pregnancy. If there has been little or no vaginal  bleeding, early small hematomas usually shrink on their own and do not affect your baby or pregnancy. The blood is gradually absorbed over 1-2 weeks. When bleeding starts later in pregnancy or the hematoma is larger or occurs in an older pregnant woman, the outcome may not be as good. Larger hematomas may get bigger, which increases the chances for miscarriage. Subchorionic hematoma also increases the risk of premature detachment of the placenta from the uterus, preterm (premature) labor, and stillbirth. HOME CARE INSTRUCTIONS  Stay on bed rest if your health care provider recommends this. Although bed rest will not prevent more bleeding or prevent a miscarriage, your health care provider may recommend bed rest until you are advised otherwise.  Avoid heavy lifting (more than 10 lb [4.5 kg]), exercise, sexual intercourse, or douching as directed by your health care provider.  Keep track of the number of pads you use each day and how soaked (saturated) they are. Write down this information.  Do not use tampons.  Keep all follow-up appointments as directed by your health care provider. Your health care provider may ask you to have follow-up blood tests or ultrasound tests or both. SEEK IMMEDIATE MEDICAL CARE IF:  You have severe cramps in your stomach, back, abdomen, or pelvis.  You have a fever.  You pass large clots or tissue. Save any tissue for your health care provider to look at.  Your bleeding increases or you become lightheaded, feel weak, or have fainting episodes. This information is not intended to replace advice given to you by your health care provider. Make sure you discuss any questions you have with your health care provider. Document Released: 12/22/2006 Document Revised: 09/27/2014 Document Reviewed: 04/05/2013 Elsevier Interactive Patient Education  2017 [employer]

## 2016-08-18 NOTE — MAU Note (Signed)
Stayed out of work today because she was cramping.  When she went to the bathroom, she saw blood, when wiped is brownish.  [facility] was told to come to office " couldn't do the drive", so came here.

## 2016-08-30 DIAGNOSIS — O09523 Supervision of elderly multigravida, third trimester: Secondary | ICD-10-CM | POA: Insufficient documentation

## 2016-08-30 DIAGNOSIS — Z98891 History of uterine scar from previous surgery: Secondary | ICD-10-CM | POA: Insufficient documentation

## 2016-09-01 DIAGNOSIS — Z8759 Personal history of other complications of pregnancy, childbirth and the puerperium: Secondary | ICD-10-CM | POA: Insufficient documentation

## 2016-09-15 ENCOUNTER — Encounter (HOSPITAL_COMMUNITY): Payer: Self-pay

## 2016-09-15 ENCOUNTER — Inpatient Hospital Stay (HOSPITAL_COMMUNITY)
Admission: AD | Admit: 2016-09-15 | Discharge: 2016-09-15 | Disposition: A | Discharge status: Home / Self Care | Payer: BLUE CROSS/BLUE SHIELD | Source: Ambulatory Visit | Attending: Obstetrics and Gynecology | Admitting: Obstetrics and Gynecology

## 2016-09-15 DIAGNOSIS — R1110 Vomiting, unspecified: Secondary | ICD-10-CM | POA: Diagnosis present

## 2016-09-15 DIAGNOSIS — O99011 Anemia complicating pregnancy, first trimester: Secondary | ICD-10-CM | POA: Diagnosis not present

## 2016-09-15 DIAGNOSIS — D573 Sickle-cell trait: Secondary | ICD-10-CM | POA: Diagnosis not present

## 2016-09-15 DIAGNOSIS — Z882 Allergy status to sulfonamides status: Secondary | ICD-10-CM | POA: Insufficient documentation

## 2016-09-15 DIAGNOSIS — O219 Vomiting of pregnancy, unspecified: Secondary | ICD-10-CM | POA: Insufficient documentation

## 2016-09-15 DIAGNOSIS — Z3A10 10 weeks gestation of pregnancy: Secondary | ICD-10-CM | POA: Diagnosis not present

## 2016-09-15 LAB — URINALYSIS, ROUTINE W REFLEX MICROSCOPIC
Bilirubin Urine: NEGATIVE
Glucose, UA: NEGATIVE mg/dL
HGB URINE DIPSTICK: NEGATIVE
Ketones, ur: NEGATIVE mg/dL
NITRITE: NEGATIVE
Protein, ur: NEGATIVE mg/dL
SPECIFIC GRAVITY, URINE: 1.015 (ref 1.005–1.030)
pH: 6 (ref 5.0–8.0)

## 2016-09-15 MED ORDER — SODIUM CHLORIDE 0.9 % IV SOLN
8.0000 mg | Freq: Once | INTRAVENOUS | Status: AC
  Administered 2016-09-15: 8 mg via INTRAVENOUS
  Filled 2016-09-15: qty 4

## 2016-09-15 MED ORDER — ONDANSETRON 8 MG PO TBDP: 8.0000 mg | ORAL_TABLET | Freq: Three times a day (TID) | ORAL | 0 refills | Status: DC | PRN

## 2016-09-15 MED ORDER — PROMETHAZINE HCL 25 MG PO TABS: 12.5000 mg | ORAL_TABLET | Freq: Four times a day (QID) | ORAL | 0 refills | Status: DC | PRN

## 2016-09-15 MED ORDER — DEXTROSE 5 % IN LACTATED RINGERS IV BOLUS
1000.0000 mL | Freq: Once | INTRAVENOUS | Status: AC
  Administered 2016-09-15: 1000 mL via INTRAVENOUS

## 2016-09-15 NOTE — MAU Provider Note (Signed)
History     CSN: [id number]  Arrival date and time: 09/15/16 2057   First Provider Initiated Contact with Patient 09/15/16 2151      Chief Complaint  Patient presents with  . Emesis   Emesis   This is a new problem. The current episode started in the past 7 days. The problem occurs 5 to 10 times per day. The problem has been unchanged. The emesis has an appearance of stomach contents. There has been no fever. Associated symptoms include abdominal pain and diarrhea. Pertinent negatives include no chills or fever. 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 Treatments tried: phenergan  The treatment provided mild relief.   Past Medical History:  Diagnosis Date  . ADHD (attention deficit hyperactivity disorder)   . Anxiety   . Chlamydia   . Sickle cell trait [facility](HCC)     Past Surgical History:  Procedure Laterality Date  . CESAREAN SECTION    . WISDOM TOOTH EXTRACTION      History reviewed. No pertinent family history.  Social History  Substance Use Topics  . Smoking status: Never Smoker  . Smokeless tobacco: Never Used  . Alcohol use No    Allergies:  Allergies  Allergen Reactions  . Sulfa Drugs Cross Reactors Anaphylaxis and Other (See Comments)    Causes drooling.    Prescriptions Prior to Admission  Medication Sig Dispense Refill Last Dose  . Prenatal Vit-Fe Fumarate-FA (PRENATAL MULTIVITAMIN) TABS tablet Take 1 tablet by mouth daily at 12 noon.   09/15/2016 at Unknown time  . promethazine (PHENERGAN) 25 MG tablet Take 1 tablet (25 mg total) by mouth every 6 (six) hours as needed for nausea or vomiting. 15 tablet 0 09/15/2016 at Unknown time  . albuterol (PROVENTIL HFA;VENTOLIN HFA) 108 (90 Base) MCG/ACT inhaler Inhale 2 puffs into the lungs every 6 (six) hours as needed for wheezing or shortness of breath.   rescue    Review of Systems  Constitutional: Negative for chills and fever.  Gastrointestinal: Positive for abdominal pain, diarrhea, nausea and  vomiting. Negative for constipation.  Genitourinary: Negative for dysuria, frequency and urgency.   Physical Exam   Blood pressure 121/64, pulse 81, temperature 98.1 F (36.7 C), temperature source Oral, resp. rate 18, height 5\' 3"  (1.6 m), weight 162 lb 4 oz (73.6 kg), last menstrual period 07/04/2016, SpO2 100 %.  Physical Exam  Nursing note and vitals reviewed. Constitutional: She is oriented to person, place, and time. She appears well-developed and well-nourished. No distress.  HENT:  Head: Normocephalic.  Cardiovascular: Normal rate.   Respiratory: Effort normal.  GI: Soft. There is no tenderness. There is no rebound.  Neurological: She is alert and oriented to person, place, and time.  Skin: Skin is warm and dry.  Psychiatric: She has a normal mood and affect.   Results for orders placed or performed during the hospital encounter of 09/15/16 (from the past 24 hour(s))  Urinalysis, Routine w reflex microscopic     Status: Abnormal   Collection Time: 09/15/16  9:24 PM  Result Value Ref Range   Color, Urine YELLOW YELLOW   APPearance HAZY (A) CLEAR   Specific Gravity, Urine 1.015 1.005 - 1.030   pH 6.0 5.0 - 8.0   Glucose, UA NEGATIVE NEGATIVE mg/dL   Hgb urine dipstick NEGATIVE NEGATIVE   Bilirubin Urine NEGATIVE NEGATIVE   Ketones, ur NEGATIVE NEGATIVE mg/dL   Protein, ur NEGATIVE NEGATIVE mg/dL   Nitrite NEGATIVE NEGATIVE   Leukocytes, UA SMALL (A) NEGATIVE  RBC / HPF 0-5 0 - 5 RBC/hpf   WBC, UA 6-30 0 - 5 WBC/hpf   Bacteria, UA MANY (A) NONE SEEN   Squamous Epithelial / LPF 6-30 (A) NONE SEEN   Mucous PRESENT    MAU Course  Procedures  MDM Reviewed risks of zofran in pregnancy. Patient is comfortable taking zofran on a limited basis at this time.   Patient has had 1L of D5LR and zofran. She is reports feeling better, and is tolerating PO. No vomiting while here.   Assessment and Plan   1. Nausea and vomiting during pregnancy   2. [redacted] weeks gestation of  pregnancy    DC home Comfort measures reviewed  1st Trimester precautions  RX: zofran PRN #20 , Phenergan PRN #30  Return to MAU as needed FU with OB as planned  Follow-up Information    [facility].   Specialty:  Obstetrics and Gynecology Contact information: [address] [place] [id number] [phone]           Tawnya CrookHogan, Elizabethanne Lusher Donovan 09/15/2016, 9:53 PM

## 2016-09-15 NOTE — MAU Note (Signed)
Patient states that she has been vomiting constantly. Patient states that she has been taking phenergan on and off for the past 3 days with no relief. Patient also has abdominal pain that she has had for the whole pregnancy. She also had some bleeding Sunday but is not bleeding currently.

## 2016-09-15 NOTE — Discharge Instructions (Signed)
Morning Sickness °Morning sickness is when you feel sick to your stomach (nauseous) during pregnancy. This nauseous feeling may or may not come with vomiting. It often occurs in the morning but can be a problem any time of day. Morning sickness is most common during the first trimester, but it may continue throughout pregnancy. While morning sickness is unpleasant, it is usually harmless unless you develop severe and continual vomiting (hyperemesis gravidarum). This condition requires more intense treatment. °What are the causes? °The cause of morning sickness is not completely known but seems to be related to normal hormonal changes that occur in pregnancy. °What increases the risk? °You are at greater risk if you: °· Experienced nausea or vomiting before your pregnancy. °· Had morning sickness during a previous pregnancy. °· Are pregnant with more than one baby, such as twins. ° °How is this treated? °Do not use any medicines (prescription, over-the-counter, or herbal) for morning sickness without first talking to your health care provider. Your health care provider may prescribe or recommend: °· Vitamin B6 supplements. °· Anti-nausea medicines. °· The herbal medicine ginger. ° °Follow these instructions at home: °· Only take over-the-counter or prescription medicines as directed by your health care provider. °· Taking multivitamins before getting pregnant can prevent or decrease the severity of morning sickness in most women. °· Eat a piece of dry toast or unsalted crackers before getting out of bed in the morning. °· Eat five or six small meals a day. °· Eat dry and bland foods (rice, baked potato). Foods high in carbohydrates are often helpful. °· Do not drink liquids with your meals. Drink liquids between meals. °· Avoid greasy, fatty, and spicy foods. °· Get someone to cook for you if the smell of any food causes nausea and vomiting. °· If you feel nauseous after taking prenatal vitamins, take the vitamins at  night or with a snack. °· Snack on protein foods (nuts, yogurt, cheese) between meals if you are hungry. °· Eat unsweetened gelatins for desserts. °· Wearing an acupressure wristband (worn for sea sickness) may be helpful. °· Acupuncture may be helpful. °· Do not smoke. °· Get a humidifier to keep the air in your house free of odors. °· Get plenty of fresh air. °Contact a health care provider if: °· Your home remedies are not working, and you need medicine. °· You feel dizzy or lightheaded. °· You are losing weight. °Get help right away if: °· You have persistent and uncontrolled nausea and vomiting. °· You pass out (faint). °This information is not intended to replace advice given to you by your health care provider. Make sure you discuss any questions you have with your health care provider. °Document Released: 10/28/2006 Document Revised: 02/12/2016 Document Reviewed: 02/21/2013 °Elsevier Interactive Patient Education © 2017 [employer] ° °

## 2017-01-23 DIAGNOSIS — O24419 Gestational diabetes mellitus in pregnancy, unspecified control: Secondary | ICD-10-CM | POA: Insufficient documentation

## 2017-02-11 DIAGNOSIS — O26893 Other specified pregnancy related conditions, third trimester: Secondary | ICD-10-CM | POA: Insufficient documentation

## 2017-03-15 DIAGNOSIS — O4700 False labor before 37 completed weeks of gestation, unspecified trimester: Secondary | ICD-10-CM | POA: Insufficient documentation

## 2017-03-16 DIAGNOSIS — O42919 Preterm premature rupture of membranes, unspecified as to length of time between rupture and onset of labor, unspecified trimester: Secondary | ICD-10-CM | POA: Insufficient documentation

## 2017-06-07 ENCOUNTER — Emergency Department (HOSPITAL_BASED_OUTPATIENT_CLINIC_OR_DEPARTMENT_OTHER)
Admission: EM | Admit: 2017-06-07 | Discharge: 2017-06-08 | Disposition: A | Discharge status: Home / Self Care | Payer: BLUE CROSS/BLUE SHIELD | Attending: Emergency Medicine | Admitting: Emergency Medicine

## 2017-06-07 ENCOUNTER — Encounter (HOSPITAL_BASED_OUTPATIENT_CLINIC_OR_DEPARTMENT_OTHER): Payer: Self-pay

## 2017-06-07 DIAGNOSIS — D573 Sickle-cell trait: Secondary | ICD-10-CM | POA: Diagnosis not present

## 2017-06-07 DIAGNOSIS — R51 Headache: Secondary | ICD-10-CM | POA: Diagnosis not present

## 2017-06-07 DIAGNOSIS — R519 Headache, unspecified: Secondary | ICD-10-CM

## 2017-06-07 DIAGNOSIS — I10 Essential (primary) hypertension: Secondary | ICD-10-CM | POA: Diagnosis present

## 2017-06-07 HISTORY — DX: Essential (primary) hypertension: I10

## 2017-06-07 NOTE — ED Triage Notes (Addendum)
Pt states she was dx with HTN while pregnant-delivered 3 mos ago-started on procardia-noncompliant and no f/u with PCP-c/o elevated BP and HA-NAD-steady gait

## 2017-06-08 NOTE — Discharge Instructions (Signed)
Keep a record of your blood pressure as discussed. Take with you to your next primary care appointment.

## 2017-06-08 NOTE — ED Provider Notes (Signed)
[facility]   CSN: [id number] Arrival date & time: 06/07/17  2128     History   Chief Complaint Chief Complaint  Patient presents with  . Hypertension    HPI Ebony Hansen is a 36 y.o. female.  Patient is three months post-partum from her third pregnancy. She developed PIH during the pregnancy.  She was started on procardia. She has not been on blood pressure medicine since. 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 Today during a headache, she noted her blood pressure to be elevated. She took aleve for the headache and it resolved.   The history is provided by the patient. No language interpreter was used.  Headache   This is a recurrent problem. The current episode started 12 to 24 hours ago. The problem has been resolved. The headache is associated with emotional stress. The pain is located in the frontal region. The patient is experiencing no pain. The pain does not radiate. Pertinent negatives include no nausea and no vomiting. She has tried NSAIDs for the symptoms. The treatment provided significant relief.    Past Medical History:  Diagnosis Date  . ADHD (attention deficit hyperactivity disorder)   . Anxiety   . Chlamydia   . Hypertension   . Sickle cell trait (HCC)     There are no active problems to display for this patient.   Past Surgical History:  Procedure Laterality Date  . CESAREAN SECTION    . WISDOM TOOTH EXTRACTION      OB History    Gravida Para Term Preterm AB Living   SAB TAB Ectopic Multiple Live Births   2 1             Home Medications    Prior to Admission medications   Not on File    Family History No family history on file.  Social History Social History  Substance Use Topics  . Smoking status: Never Smoker  . Smokeless tobacco: Never Used  . Alcohol use No     Allergies   Sulfa drugs cross reactors   Review of Systems Review of  Systems  Gastrointestinal: Negative for nausea and vomiting.  Neurological: Positive for headaches.  All other systems reviewed and are negative.    Physical Exam Updated Vital Signs BP (!) 149/91 (BP Location: Left Arm)   Pulse 60   Temp 98.9 F (37.2 C) (Oral)   Resp 18   Ht  (1.6 m)   Wt 82.1 kg (181 lb)   LMP 05/31/2017   SpO2 100%   Breastfeeding? Unknown   BMI 32.06 kg/m   Physical Exam  Constitutional: She is oriented to person, place, and time. She appears well-developed and well-nourished.  HENT:  Head: Normocephalic.  Eyes: Pupils are equal, round, and reactive to light. Conjunctivae and EOM are normal.  Neck: Neck supple.  Cardiovascular: Normal rate and regular rhythm.   Blood pressure checked manually:  122/78  Pulmonary/Chest: Effort normal and breath sounds normal.  Abdominal: Soft.  Musculoskeletal: Normal range of motion.  Neurological: She is alert and oriented to person, place, and time. No cranial nerve deficit or sensory deficit. Coordination normal.  Skin: Skin is warm and dry.  Psychiatric: She has a normal mood and affect.  Nursing note and vitals reviewed.    ED Treatments / Results  Labs (all labs ordered are listed, but only abnormal  results are displayed) Labs Reviewed - No data to display  EKG  EKG Interpretation None       Radiology No results found.  Procedures Procedures (including critical care time)  Medications Ordered in ED Medications - No data to display   Initial Impression / Assessment and Plan / ED Course  I have reviewed the triage vital signs and the nursing notes.  Pertinent labs & imaging results that were available during my care of the patient were reviewed by me and considered in my medical decision making (see chart for details).     Pt HA treated and improved while in ED.  Presentation is  non concerning for [facility], ICH, Meningitis, or temporal arteritis. Pt is afebrile with no focal neuro  deficits, nuchal rigidity, or change in vision. Pt is to follow up with PCP to discuss prophylactic medication. Pt verbalizes understanding and is agreeable with plan to dc.   Patient noted to be hypertensive in the emergency department.  No signs of hypertensive urgency.  Discussed with patient the need for close follow-up and management by their primary care physician.   Final Clinical Impressions(s) / ED Diagnoses   Final diagnoses:  None    New Prescriptions New Prescriptions   No medications on file     Felicie Morn, NP 06/08/17 0032    Paula Libra, MD 06/08/17 [phone]

## 2017-06-08 NOTE — ED Notes (Signed)
PT discharged to home with family. NAD. 

## 2017-11-08 ENCOUNTER — Emergency Department (HOSPITAL_BASED_OUTPATIENT_CLINIC_OR_DEPARTMENT_OTHER)
Admission: EM | Admit: 2017-11-08 | Discharge: 2017-11-08 | Disposition: A | Discharge status: Home / Self Care | Payer: BLUE CROSS/BLUE SHIELD | Attending: Emergency Medicine | Admitting: Emergency Medicine

## 2017-11-08 ENCOUNTER — Emergency Department (HOSPITAL_BASED_OUTPATIENT_CLINIC_OR_DEPARTMENT_OTHER): Payer: BLUE CROSS/BLUE SHIELD

## 2017-11-08 ENCOUNTER — Encounter (HOSPITAL_BASED_OUTPATIENT_CLINIC_OR_DEPARTMENT_OTHER): Payer: Self-pay | Admitting: Emergency Medicine

## 2017-11-08 ENCOUNTER — Other Ambulatory Visit: Payer: Self-pay

## 2017-11-08 DIAGNOSIS — J111 Influenza due to unidentified influenza virus with other respiratory manifestations: Secondary | ICD-10-CM | POA: Diagnosis not present

## 2017-11-08 DIAGNOSIS — D573 Sickle-cell trait: Secondary | ICD-10-CM | POA: Insufficient documentation

## 2017-11-08 DIAGNOSIS — R05 Cough: Secondary | ICD-10-CM | POA: Diagnosis present

## 2017-11-08 DIAGNOSIS — I10 Essential (primary) hypertension: Secondary | ICD-10-CM | POA: Diagnosis not present

## 2017-11-08 MED ORDER — IBUPROFEN 400 MG PO TABS
400.0000 mg | ORAL_TABLET | Freq: Once | ORAL | Status: AC
  Administered 2017-11-08: 400 mg via ORAL
  Filled 2017-11-08: qty 1

## 2017-11-08 MED ORDER — GUAIFENESIN 100 MG/5ML PO SOLN
5.0000 mL | Freq: Once | ORAL | Status: AC
  Administered 2017-11-08: 100 mg via ORAL
  Filled 2017-11-08: qty 10

## 2017-11-08 MED ORDER — GUAIFENESIN-DM 100-10 MG/5ML PO SYRP
15.0000 mL | ORAL_SOLUTION | Freq: Once | ORAL | Status: DC
  Filled 2017-11-08: qty 15

## 2017-11-08 NOTE — ED Triage Notes (Signed)
Pt c/o cold/sinus sxs x 2 wks; fatigue today

## 2017-11-08 NOTE — Discharge Instructions (Signed)
It was our pleasure to provide your ER care today - we hope that you feel better.  Your chest xray was read as being normal/good.  Your symptoms appear most consistent with the flu or a flu-like illness.   Take acetaminophen and/or ibuprofen as need for fever and body aches.   You may also try an over the counter cold/flu medication as need for symptom relief.  Rest. Drink adequate fluids.  Follow up with primary care doctor in 1 week if symptoms fail to improve/resolve.  Return to ER if worse, increased trouble breathing, other concern.

## 2017-11-08 NOTE — ED Provider Notes (Signed)
[facility] EMERGENCY DEPARTMENT Provider Note   CSN: [id number] Arrival date & time: 11/08/17  [id number]     History   Chief Complaint Chief Complaint  Patient presents with  . Cough  . Nasal Congestion    HPI Ebony Hansen is a 37 y.o. female.  Patient c/o productive cough, congestion, body aches, subjective fever, in past week. 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 No neck pain/stiffness. No chest pain or sob. No abd pain or nvd. Symptoms moderate, persistent.    The history is provided by the patient.  Cough  Associated symptoms include myalgias. Pertinent negatives include no chest pain, no sore throat, no shortness of breath and no eye redness.    Past Medical History:  Diagnosis Date  . ADHD (attention deficit hyperactivity disorder)   . Anxiety   . Chlamydia   . Hypertension   . Sickle cell trait (HCC)     There are no active problems to display for this patient.   Past Surgical History:  Procedure Laterality Date  . CESAREAN SECTION    . WISDOM TOOTH EXTRACTION      OB History    Gravida Para Term Preterm AB Living   6 2 2   3 2    SAB TAB Ectopic Multiple Live Births   2 1             Home Medications    Prior to Admission medications   Not on File    Family History No family history on file.  Social History Social History   Tobacco Use  . Smoking status: Never Smoker  . Smokeless tobacco: Never Used  Substance Use Topics  . Alcohol use: No  . Drug use: No     Allergies   Sulfa drugs cross reactors   Review of Systems Review of Systems  Constitutional: Positive for fever.  HENT: Positive for congestion. Negative for sore throat.   Eyes: Negative for redness.  Respiratory: Positive for cough. Negative for shortness of breath.   Cardiovascular: Negative for chest pain.  Gastrointestinal: Negative for abdominal pain and vomiting.  Genitourinary: Negative for flank pain.    Musculoskeletal: Positive for myalgias. Negative for neck pain and neck stiffness.  Skin: Negative for rash.  Neurological: Negative for light-headedness.  Hematological: Does not bruise/bleed easily.  Psychiatric/Behavioral: Negative for confusion.     Physical Exam Updated Vital Signs BP (!) 134/94 (BP Location: Left Arm)   Pulse 64   Temp 98.8 F (37.1 C) (Oral)   Resp 20   LMP 10/23/2017   SpO2 100%   Physical Exam  Constitutional: She appears well-developed and well-nourished. No distress.  HENT:  Mouth/Throat: Oropharynx is clear and moist.  Nasal congestion  Eyes: Conjunctivae are normal. No scleral icterus.  Neck: Neck supple. No tracheal deviation present.  Cardiovascular: Normal rate, regular rhythm, normal heart sounds and intact distal pulses. Exam reveals no gallop and no friction rub.  No murmur heard. Pulmonary/Chest: Effort normal. No respiratory distress.  Coughing, upper resp congestion  Abdominal: Soft. Normal appearance. She exhibits no distension. There is no tenderness.  Musculoskeletal: She exhibits no edema.  Neurological: She is alert.  Skin: Skin is warm and dry. No rash noted. She is not diaphoretic.  Psychiatric: She has a normal mood and affect.  Nursing note and vitals reviewed.    ED Treatments / Results  Labs (all labs ordered are listed, but only abnormal  results are displayed) Labs Reviewed - No data to display  EKG  EKG Interpretation None       Radiology Dg Chest 2 View  Result Date: 11/08/2017 CLINICAL DATA:  Cough and congestion for 2 weeks EXAM: CHEST  2 VIEW COMPARISON:  06/03/2015 FINDINGS: The heart size and mediastinal contours are within normal limits. Both lungs are clear. The visualized skeletal structures are unremarkable. IMPRESSION: No active cardiopulmonary disease. Electronically Signed   By: Alcide Clever M.D.   On: 11/08/2017 09:34    Procedures Procedures (including critical care time)  Medications  Ordered in ED Medications  ibuprofen (ADVIL,MOTRIN) tablet 400 mg (not administered)  guaiFENesin-dextromethorphan (ROBITUSSIN DM) 100-10 MG/5ML syrup 15 mL (not administered)     Initial Impression / Assessment and Plan / ED Course  I have reviewed the triage vital signs and the nursing notes.  Pertinent labs & imaging results that were available during my care of the patient were reviewed by me and considered in my medical decision making (see chart for details).  No meds prior to coming this AM.  Motrin po.  Cxr.  Reviewed nursing notes and prior charts for additional history.   cxr reviewed - no pneumonia.  Patient symptoms/exam felt most c/w flu.  As symptoms present for several days, feel no benefit of tamiflu.  Pt appears stable for d/c.     Final Clinical Impressions(s) / ED Diagnoses   Final diagnoses:  None    ED Discharge Orders    None       Cathren Laine, MD 11/08/17 [phone]

## 2018-06-13 ENCOUNTER — Emergency Department (HOSPITAL_COMMUNITY)
Admission: EM | Admit: 2018-06-13 | Discharge: 2018-06-13 | Disposition: A | Discharge status: Home / Self Care | Payer: BLUE CROSS/BLUE SHIELD | Attending: Emergency Medicine | Admitting: Emergency Medicine

## 2018-06-13 ENCOUNTER — Encounter (HOSPITAL_COMMUNITY): Payer: Self-pay | Admitting: Emergency Medicine

## 2018-06-13 ENCOUNTER — Other Ambulatory Visit: Payer: Self-pay

## 2018-06-13 DIAGNOSIS — I10 Essential (primary) hypertension: Secondary | ICD-10-CM | POA: Diagnosis not present

## 2018-06-13 DIAGNOSIS — M5442 Lumbago with sciatica, left side: Secondary | ICD-10-CM | POA: Insufficient documentation

## 2018-06-13 DIAGNOSIS — M545 Low back pain: Secondary | ICD-10-CM | POA: Diagnosis present

## 2018-06-13 MED ORDER — NAPROXEN 250 MG PO TABS
375.0000 mg | ORAL_TABLET | Freq: Once | ORAL | Status: AC
  Administered 2018-06-13: 375 mg via ORAL
  Filled 2018-06-13: qty 2

## 2018-06-13 MED ORDER — METHOCARBAMOL 500 MG PO TABS: 500.0000 mg | ORAL_TABLET | Freq: Two times a day (BID) | ORAL | 0 refills | Status: DC

## 2018-06-13 MED ORDER — NAPROXEN 375 MG PO TABS: 375.0000 mg | ORAL_TABLET | Freq: Two times a day (BID) | ORAL | 0 refills | Status: DC

## 2018-06-13 NOTE — ED Triage Notes (Signed)
Pt c/o lower back pain that radiates to left leg. 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 Has been taking tramadol with little relief. Ambulatory without difficulty.

## 2018-06-13 NOTE — ED Provider Notes (Signed)
Patient placed in Quick Look pathway, seen and evaluated   Chief Complaint: Back pain  HPI: Presents with left lower back pain that radiates examined left thigh.  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  She denies numbness, weakness or loss of bowel or bladder control. Takes tramadol without relief.   ROS: No gait problem  Physical Exam:   Gen: No distress  Neuro: Awake and Alert  Skin: Warm    Focused Exam: Tender to palpation diffusely over l-spine and over left paraspinal muscles of lumbar spine. Gait normal in coordination and balance.    Initiation of care has begun. The patient has been counseled on the process, plan, and necessity for staying for the completion/evaluation, and the remainder of the medical screening examination    Lawrence MarseillesShrosbree, Lynette Topete J, PA-C 06/13/18 2051    Melene PlanFloyd, Dan, DO 06/13/18 2213

## 2018-06-13 NOTE — ED Provider Notes (Signed)
[facility] Provider Note   CSN: [id number] Arrival date & time: 06/13/18  1823     History   Chief Complaint Chief Complaint  Patient presents with  . Back Pain    HPI Ebony Hansen is a 37 y.o. female.  Patient works as a Pension scheme managerspecial education teacher. During the course of her work, Ebony Hansen had to restrain an autistic child. In doing so, Ebony Hansen strained her back.    Back Pain   This is a new problem. The current episode started more than 2 days ago. The problem occurs daily. The problem has been gradually worsening. The pain is associated with twisting (restraining an autistic child). The pain is present in the lumbar spine. The quality of the pain is described as stabbing and shooting. The pain radiates to the left thigh. The pain is moderate. The symptoms are aggravated by bending and twisting. The pain is the same all the time. Associated symptoms include leg pain. Pertinent negatives include no abdominal pain, no bladder incontinence, no paresthesias and no weakness. Ebony Hansen has tried NSAIDs for the symptoms. The treatment provided no relief.    Past Medical History:  Diagnosis Date  . ADHD (attention deficit hyperactivity disorder)   . Anxiety   . Chlamydia   . Hypertension   . Sickle cell trait (HCC)     There are no active problems to display for this patient.   Past Surgical History:  Procedure Laterality Date  . CESAREAN SECTION    . WISDOM TOOTH EXTRACTION       OB History    Gravida  6   Para  2   Term  2   Preterm      AB  3   Living  2     SAB  2   TAB  1   Ectopic      Multiple      Live Births               Home Medications    Prior to Admission medications   Not on File    Family History No family history on file.  Social History Social History   Tobacco Use  . Smoking status: Never Smoker  . Smokeless tobacco: Never Used  Substance Use Topics  . Alcohol use: No  . Drug use: No      Allergies   Sulfa drugs cross reactors   Review of Systems Review of Systems  Gastrointestinal: Negative for abdominal pain.  Genitourinary: Negative for bladder incontinence.  Musculoskeletal: Positive for back pain.  Neurological: Negative for weakness and paresthesias.  All other systems reviewed and are negative.    Physical Exam Updated Vital Signs BP 125/79 (BP Location: Right Arm)   Pulse 69   Temp 99.1 F (37.3 C) (Oral)   Resp 18   SpO2 100%   Physical Exam  Constitutional: Ebony Hansen is oriented to person, place, and time. Ebony Hansen appears well-developed and well-nourished.  HENT:  Head: Atraumatic.  Eyes: Conjunctivae are normal.  Neck: Neck supple.  Cardiovascular: Normal rate and regular rhythm.  Pulmonary/Chest: Effort normal and breath sounds normal.  Abdominal: Soft. Bowel sounds are normal.  Musculoskeletal: Normal range of motion.       Lumbar back: Ebony Hansen exhibits spasm.       Back:  Neurological: Ebony Hansen is alert and oriented to person, place, and time.  Skin: Skin is warm and dry.  Psychiatric: Ebony Hansen has a normal mood and affect.  Nursing note and vitals reviewed.    ED Treatments / Results  Labs (all labs ordered are listed, but only abnormal results are displayed) Labs Reviewed - No data to display  EKG None  Radiology No results found.  Procedures Procedures (including critical care time)  Medications Ordered in ED Medications  naproxen (NAPROSYN) tablet 375 mg (has no administration in time range)     Initial Impression / Assessment and Plan / ED Course  I have reviewed the triage vital signs and the nursing notes.  Pertinent labs & imaging results that were available during my care of the patient were reviewed by me and considered in my medical decision making (see chart for details).     Patient with back pain.  No neurological deficits and normal neuro exam.  Patient is ambulatory.  No loss of bowel or bladder control.  No concern  for cauda equina.  No fever, night sweats, weight loss, h/o cancer, IVDA, no recent procedure to back. No urinary symptoms suggestive of UTI.  Supportive care and return precaution discussed. Appears safe for discharge at this time. Follow up as indicated in discharge paperwork.   Final Clinical Impressions(s) / ED Diagnoses   Final diagnoses:  Acute left-sided low back pain with left-sided sciatica    ED Discharge Orders         Ordered    naproxen (NAPROSYN) 375 MG tablet  2 times daily     06/13/18 2200    methocarbamol (ROBAXIN) 500 MG tablet  2 times daily     06/13/18 2200           Felicie Morn, NP 06/14/18 0104    Lorre Nick, MD 06/14/18 1501

## 2018-08-17 IMAGING — US US OB TRANSVAGINAL
1 series · 15 of 28 positions shown · non-contrast
Comparison: Pelvic ultrasound performed 10/27/2008

CLINICAL DATA: Acute onset of severe lower abdominal cramping and
vaginal spotting. Initial encounter.

EXAM:
OBSTETRIC <14 WK US AND TRANSVAGINAL OB US
TECHNIQUE: Both transabdominal and transvaginal ultrasound examinations were
performed for complete evaluation of the gestation as well as the
maternal uterus, adnexal regions, and pelvic cul-de-sac.
Transvaginal technique was performed to assess early pregnancy.

[Series 1: us ob transvaginal · 15 of 62 slices shown]
[im 1/62]
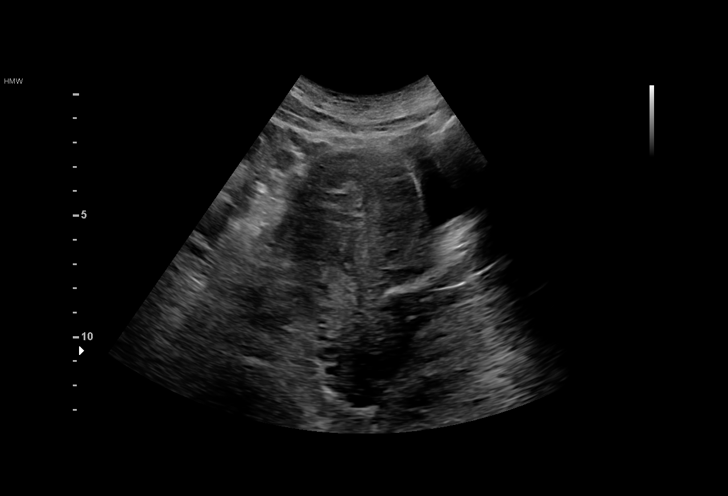
[im 5/62]
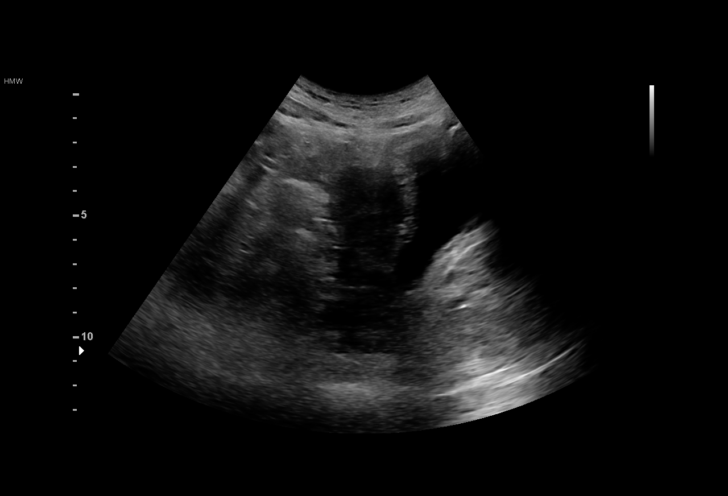
[im 10/62]
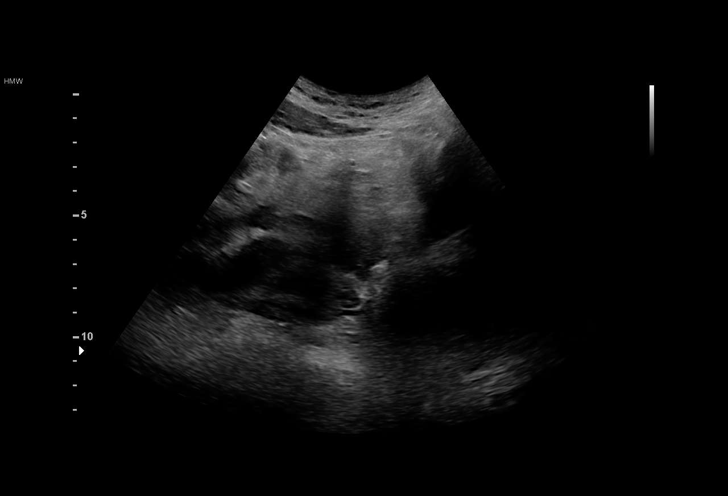
[im 14/62]
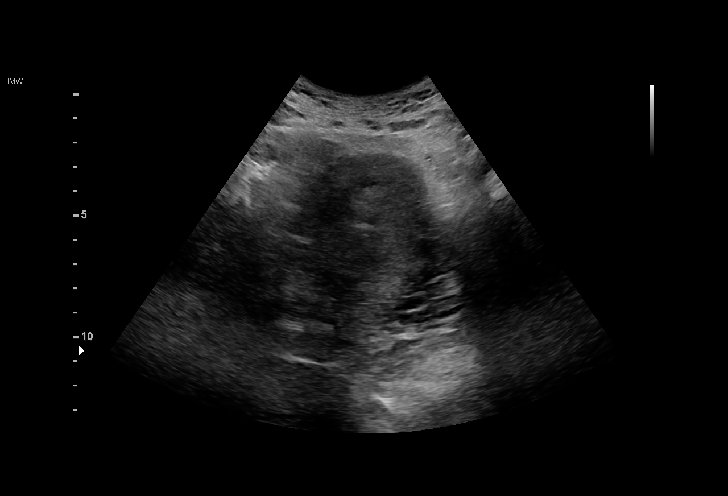
[im 19/62]
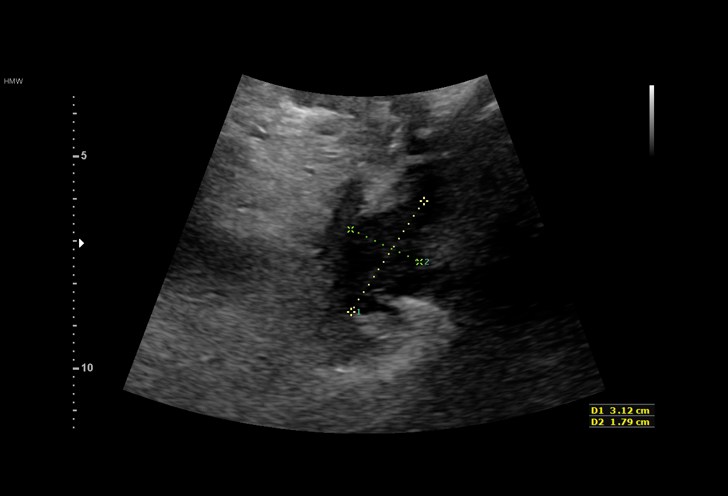
[im 23/62]
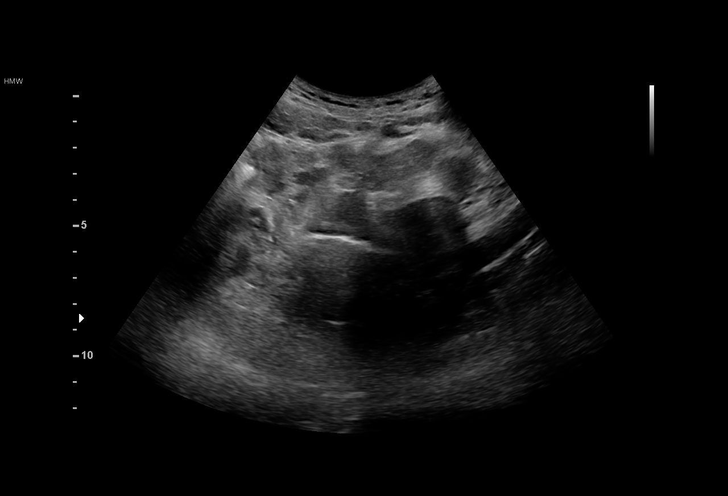
[im 28/62]
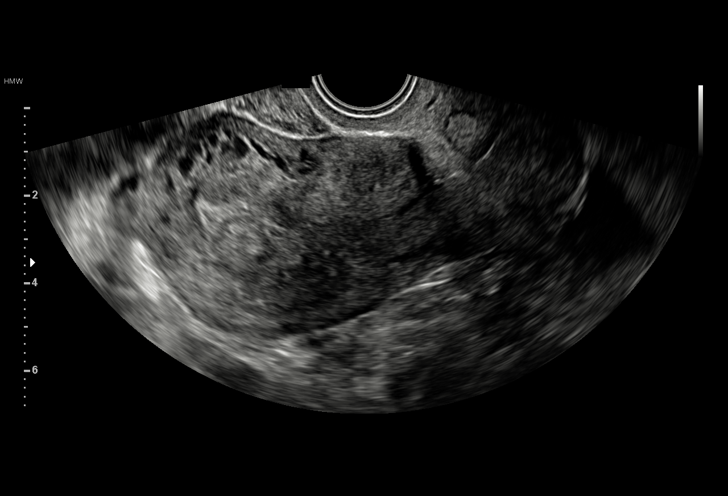
[im 32/62]
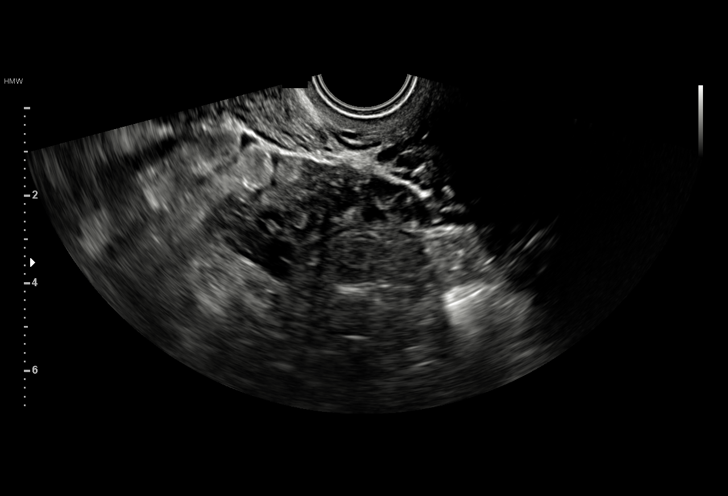
[im 34/62]
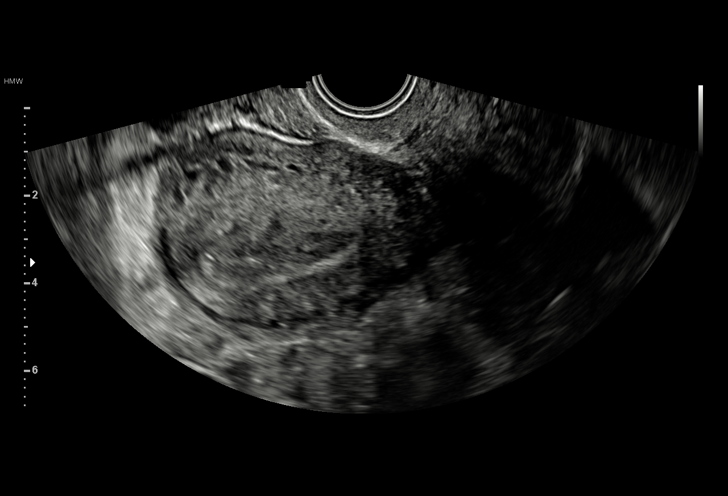
[im 39/62]
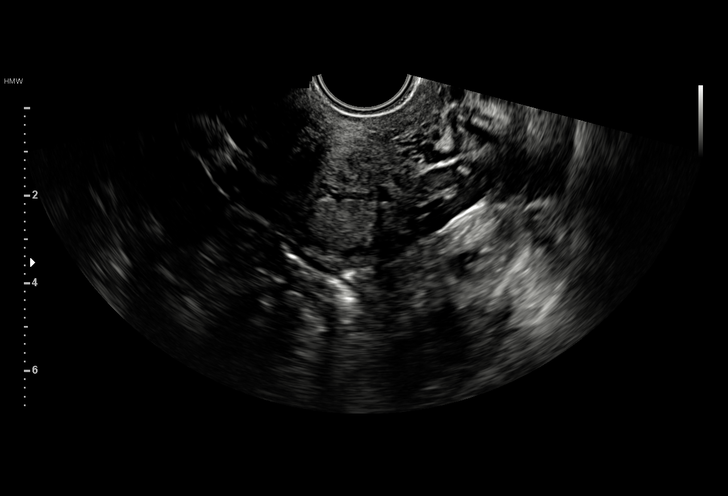
[im 43/62]
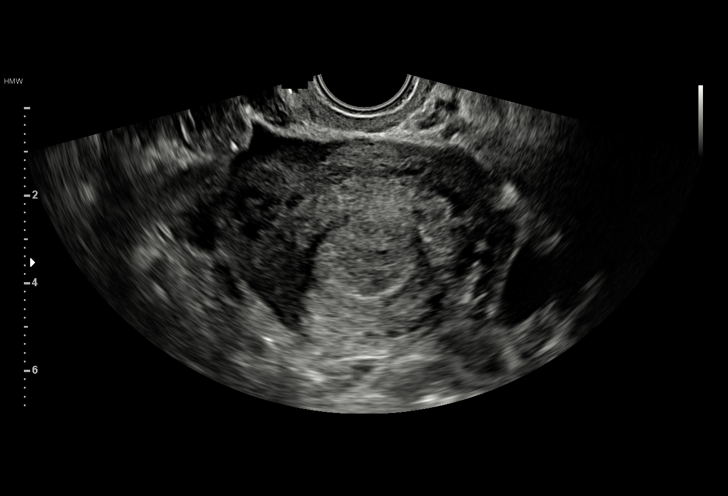
[im 48/62]
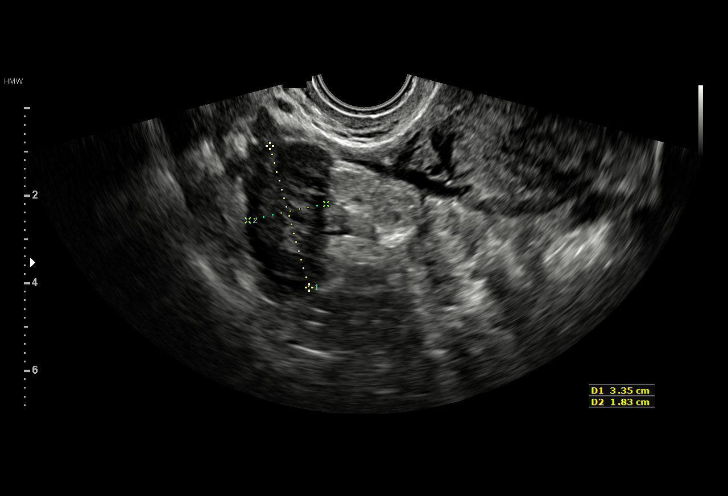
[im 52/62]
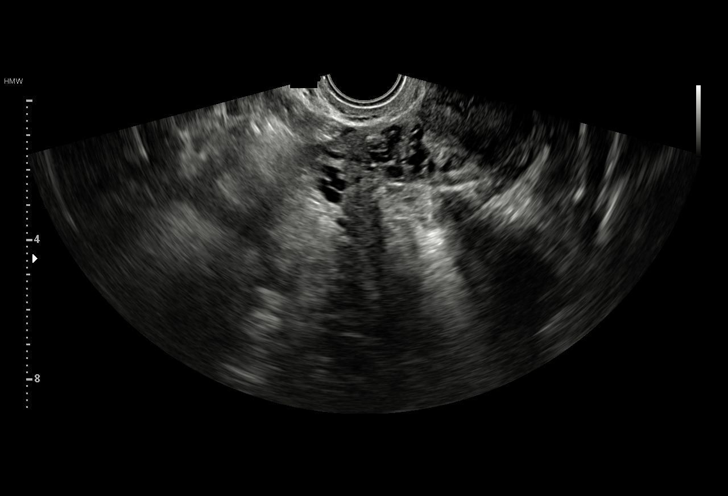
[im 57/62]
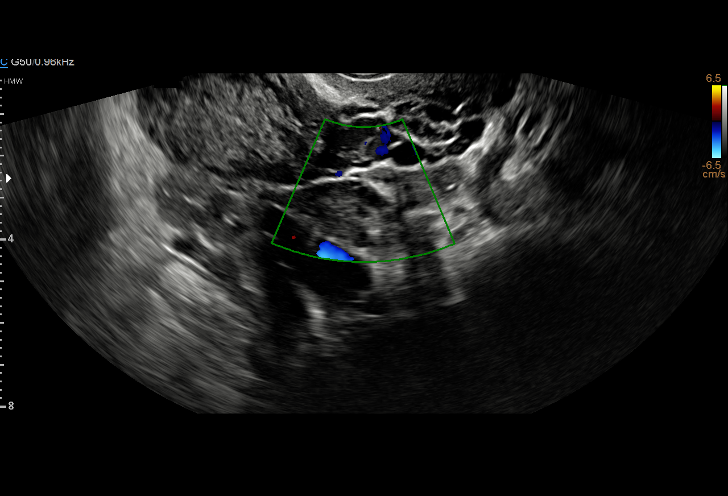
[im 62/62]
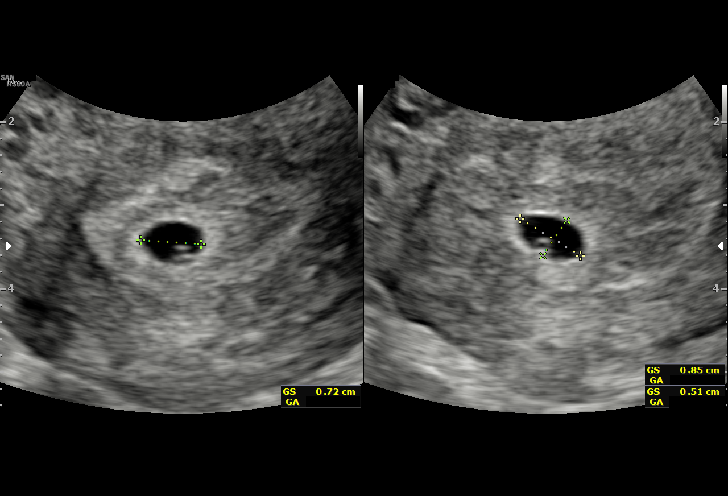

[15 of 28 positions shown; findings below may reference images not displayed]

FINDINGS: Intrauterine gestational sac: Single; visualized and normal in
shape.

Yolk sac:  Yes

Embryo:  No

Cardiac Activity: N/A

MSD: 6.9  mm   5 w   2  d

Subchorionic hemorrhage:  None visualized.

Maternal uterus/adnexae: The uterus is otherwise unremarkable.

The ovaries are within normal limits. The right ovary measures with
3.5 x 1.8 x 3.4 cm, while the left ovary measures 3.3 x 1.4 x
cm. No suspicious adnexal masses are seen; there is no evidence for
ovarian torsion.

Trace free fluid is noted within the pelvic cul-de-sac.
IMPRESSION: Single intrauterine gestational sac noted, with a mean sac diameter
of 7 mm, corresponding to a gestational age of 5 weeks 2 days. This
matches the gestational age of 5 weeks 3 days by LMP, reflecting an
estimated date of delivery April 10, 2017. No embryo yet seen,
within normal limits. A yolk sac is visualized.

## 2018-09-25 ENCOUNTER — Emergency Department (HOSPITAL_COMMUNITY)
Admission: EM | Admit: 2018-09-25 | Discharge: 2018-09-25 | Disposition: A | Discharge status: Home / Self Care | Payer: BLUE CROSS/BLUE SHIELD | Attending: Emergency Medicine | Admitting: Emergency Medicine

## 2018-09-25 ENCOUNTER — Encounter (HOSPITAL_COMMUNITY): Payer: Self-pay | Admitting: Emergency Medicine

## 2018-09-25 ENCOUNTER — Other Ambulatory Visit: Payer: Self-pay

## 2018-09-25 DIAGNOSIS — Z79899 Other long term (current) drug therapy: Secondary | ICD-10-CM | POA: Insufficient documentation

## 2018-09-25 DIAGNOSIS — R21 Rash and other nonspecific skin eruption: Secondary | ICD-10-CM | POA: Diagnosis not present

## 2018-09-25 MED ORDER — PREDNISONE 20 MG PO TABS
60.0000 mg | ORAL_TABLET | Freq: Once | ORAL | Status: AC
  Administered 2018-09-25: 60 mg via ORAL
  Filled 2018-09-25: qty 3

## 2018-09-25 MED ORDER — FAMOTIDINE 20 MG PO TABS
20.0000 mg | ORAL_TABLET | Freq: Once | ORAL | Status: AC
  Administered 2018-09-25: 20 mg via ORAL
  Filled 2018-09-25: qty 1

## 2018-09-25 MED ORDER — DIPHENHYDRAMINE HCL 25 MG PO CAPS
25.0000 mg | ORAL_CAPSULE | Freq: Once | ORAL | Status: AC
  Administered 2018-09-25: 25 mg via ORAL
  Filled 2018-09-25: qty 1

## 2018-09-25 MED ORDER — ALBUTEROL SULFATE (2.5 MG/3ML) 0.083% IN NEBU
5.0000 mg | INHALATION_SOLUTION | Freq: Once | RESPIRATORY_TRACT | Status: AC
  Administered 2018-09-25: 5 mg via RESPIRATORY_TRACT
  Filled 2018-09-25: qty 6

## 2018-09-25 MED ORDER — PREDNISONE 10 MG PO TABS: ORAL_TABLET | ORAL | 0 refills | Status: DC

## 2018-09-25 NOTE — ED Triage Notes (Addendum)
C/o generalized itchy rash all over and burning to scalp when water touches it x 2 days.  States she is allergic to dust and has been cleaning in her dusty apartment for the last 2 days.  Denies pain at present- only pain is burning if water touches scalp.  Took 1 tsp of Benadryl at 0145.

## 2018-09-25 NOTE — ED Notes (Signed)
Pt states she is starting to itch more and feels like Benadryl is wearing off.

## 2018-09-25 NOTE — Discharge Instructions (Addendum)
Take prednisone as prescribed. Take benadryl 25 mg every 6 hours x 3 days, then as needed. Take Pepcid, 20 mg twice daily for 3 days, then as needed for symptoms. If rash and scalp burning persist, follow up with your doctor or with your dermatologist for further evaluation. Use your inhaler every 4 hours if needed for any wheezing.

## 2018-09-25 NOTE — ED Provider Notes (Signed)
[facility] EMERGENCY DEPARTMENT Provider Note   CSN: [id number] Arrival date & time: 09/25/18  0151     History   Chief Complaint Chief Complaint  Patient presents with  . Rash  . Allergic Reaction    HPI Ebony Hansen is a 38 y.o. female.  Patient to ED with complaint of rash that started 2 days ago. The rash is generalized, including scalp, and is pruritic with the exception of scalp which burns and is painful. She took Benadryl 25 mg prior to arrival with some relief of itching. She denies shortness of breath, but also states that she feels sxs of mild asthma that she feels the need to use her inhaler for. She has a known dust allergy and has been doing heavy cleaning at her apartment and was concerned she might be having a reaction.    The history is provided by the patient. No language interpreter was used.  Rash    Allergic Reaction  Presenting symptoms: rash   Presenting symptoms: no difficulty swallowing     Past Medical History:  Diagnosis Date  . ADHD (attention deficit hyperactivity disorder)   . Anxiety   . Chlamydia   . Hypertension   . Sickle cell trait (HCC)     There are no active problems to display for this patient.   Past Surgical History:  Procedure Laterality Date  . CESAREAN SECTION    . WISDOM TOOTH EXTRACTION       OB History    Gravida  6   Para  2   Term  2   Preterm      AB  3   Living  2     SAB  2   TAB  1   Ectopic      Multiple      Live Births               Home Medications    Prior to Admission medications   Medication Sig Start Date End Date Taking? Authorizing Provider  methocarbamol (ROBAXIN) 500 MG tablet Take 1 tablet (500 mg total) by mouth 2 (two) times daily. 06/13/18   Felicie Morn, NP  naproxen (NAPROSYN) 375 MG tablet Take 1 tablet (375 mg total) by mouth 2 (two) times daily. 06/13/18   Felicie Morn, NP    Family History No family history on file.  Social  History Social History   Tobacco Use  . Smoking status: Never Smoker  . Smokeless tobacco: Never Used  Substance Use Topics  . Alcohol use: No  . Drug use: No     Allergies   Sulfa drugs cross reactors   Review of Systems Review of Systems  Constitutional: Negative for chills and fever.  HENT: Negative.  Negative for facial swelling and trouble swallowing.   Respiratory:       See HPI.  Cardiovascular: Negative.   Gastrointestinal: Negative.   Musculoskeletal: Negative.   Skin: Positive for rash.       See HPI.  Neurological: Negative.      Physical Exam Updated Vital Signs BP 132/79 (BP Location: Right Arm)   Pulse 76   Temp 97.9 F (36.6 C) (Oral)   Resp (!) 26   SpO2 100%   Physical Exam Constitutional:      Appearance: She is well-developed.  HENT:     Head: Normocephalic.     Comments: No scalp redness, rash, lesion, scaling. No facial swelling. Oropharynx is benign. Moist mucosa.  Neck:     Musculoskeletal: Normal range of motion and neck supple.  Cardiovascular:     Rate and Rhythm: Normal rate and regular rhythm.  Pulmonary:     Effort: Pulmonary effort is normal.     Breath sounds: Normal breath sounds.  Abdominal:     General: Bowel sounds are normal.     Palpations: Abdomen is soft.     Tenderness: There is no abdominal tenderness. There is no guarding or rebound.  Musculoskeletal: Normal range of motion.  Skin:    General: Skin is warm and dry.     Findings: No rash.     Comments: There is a non-hive, raised, slightly erythematous rash predominantly to UE's bilaterally. There is no prediclection for flexures. Hands are sparred. Minimal similar rash to LE's.   Neurological:     Mental Status: She is alert.      ED Treatments / Results  Labs (all labs ordered are listed, but only abnormal results are displayed) Labs Reviewed - No data to display  EKG None  Radiology No results found.  Procedures Procedures (including critical  care time)  Medications Ordered in ED Medications  albuterol (PROVENTIL) (2.5 MG/3ML) 0.083% nebulizer solution 5 mg (5 mg Nebulization Given 09/25/18 0426)  predniSONE (DELTASONE) tablet 60 mg (60 mg Oral Given 09/25/18 0426)  famotidine (PEPCID) tablet 20 mg (20 mg Oral Given 09/25/18 0426)  diphenhydrAMINE (BENADRYL) capsule 25 mg (25 mg Oral Given 09/25/18 0426)     Initial Impression / Assessment and Plan / ED Course  I have reviewed the triage vital signs and the nursing notes.  Pertinent labs & imaging results that were available during my care of the patient were reviewed by me and considered in my medical decision making (see chart for details).     Patient to ED for rash and scalp burning x 2 days. Benadryl with partial relief.   Doubt parasitic rash given distribution. Possibly contact dermatitis. Benadryl, Pepcid and Prednisone provided with significant improvement on re-evaluation.   She is given a nebulizer treatment with resolution of any SOB.   She can be discharged home. Will provide 6 day taper dose of prednisone, continue benadryl and pepcid. Encouraged follow up with PCP.   Final Clinical Impressions(s) / ED Diagnoses   Final diagnoses:  None   1. Rash  ED Discharge Orders    None       Danne Harbor 09/26/18 6629    Dione Booze, MD 09/26/18 0745    Dione Booze, MD 09/26/18 [phone]

## 2019-03-14 ENCOUNTER — Emergency Department (HOSPITAL_BASED_OUTPATIENT_CLINIC_OR_DEPARTMENT_OTHER)
Admission: EM | Admit: 2019-03-14 | Discharge: 2019-03-14 | Disposition: A | Discharge status: Home / Self Care | Payer: BC Managed Care – PPO | Attending: Emergency Medicine | Admitting: Emergency Medicine

## 2019-03-14 ENCOUNTER — Encounter (HOSPITAL_BASED_OUTPATIENT_CLINIC_OR_DEPARTMENT_OTHER): Payer: Self-pay | Admitting: *Deleted

## 2019-03-14 ENCOUNTER — Other Ambulatory Visit: Payer: Self-pay

## 2019-03-14 DIAGNOSIS — R5381 Other malaise: Secondary | ICD-10-CM | POA: Insufficient documentation

## 2019-03-14 DIAGNOSIS — I10 Essential (primary) hypertension: Secondary | ICD-10-CM | POA: Insufficient documentation

## 2019-03-14 DIAGNOSIS — R509 Fever, unspecified: Secondary | ICD-10-CM | POA: Insufficient documentation

## 2019-03-14 DIAGNOSIS — Z79899 Other long term (current) drug therapy: Secondary | ICD-10-CM | POA: Diagnosis not present

## 2019-03-14 DIAGNOSIS — H9202 Otalgia, left ear: Secondary | ICD-10-CM | POA: Diagnosis present

## 2019-03-14 DIAGNOSIS — Z8632 Personal history of gestational diabetes: Secondary | ICD-10-CM | POA: Insufficient documentation

## 2019-03-14 DIAGNOSIS — H60502 Unspecified acute noninfective otitis externa, left ear: Secondary | ICD-10-CM | POA: Insufficient documentation

## 2019-03-14 MED ORDER — AMOXICILLIN-POT CLAVULANATE 875-125 MG PO TABS: 1.0000 | ORAL_TABLET | Freq: Two times a day (BID) | ORAL | 0 refills | Status: DC

## 2019-03-14 MED ORDER — CIPROFLOXACIN-DEXAMETHASONE 0.3-0.1 % OT SUSP: 4.0000 [drp] | Freq: Two times a day (BID) | OTIC | 0 refills | Status: DC

## 2019-03-14 MED FILL — DEXAMETHASONE 0.1% EYE DROP: 0.1 | 13 days supply | Qty: 5 | Fill #0

## 2019-03-14 MED FILL — AMOX-CLAV 875-125 MG TABLET: 875-125 | 7 days supply | Qty: 14 | Fill #0

## 2019-03-14 MED FILL — CIPROFLOXACIN HCL 0.3 % SOL: 0.3 | 13 days supply | Qty: 5 | Fill #0

## 2019-03-14 NOTE — ED Provider Notes (Signed)
[facility] EMERGENCY DEPARTMENT Provider Note   CSN: [id number] Arrival date & time: 03/14/19  1048     History   Chief Complaint Chief Complaint  Patient presents with  . Otalgia    HPI Ebony Hansen is a 38 y.o. female.     The history is provided by the patient. No language interpreter was used.  Otalgia  Ebony Hansen is a 38 y.o. female who presents to the Emergency Department complaining of earache. She complains of one week of left sided earache with drainage. She has been experiencing one month of intermittent drainage from her ears. Initially had drainage from her right ear was treated with amoxicillin for an ear infection in the right ear drainage and pain resolved. One week ago she then developed left sided tear drainage and pain. She reports of fever to 101 at home yesterday. She has associated malaise. She denies any nausea, vomiting, difficulty breathing, cough. She does use earbuds and uses Q-tips about every other day in her ears. She has a history of gestational diabetes.   Past Medical History:  Diagnosis Date  . ADHD (attention deficit hyperactivity disorder)   . Anxiety   . Chlamydia   . Hypertension   . Sickle cell trait ([place])     There are no active problems to display for this patient.   Past Surgical History:  Procedure Laterality Date  . CESAREAN SECTION    . WISDOM TOOTH EXTRACTION       OB History    Gravida  6   Para  2   Term  2   Preterm      AB  3   Living  2     SAB  2   TAB  1   Ectopic      Multiple      Live Births               Home Medications    Prior to Admission medications   Medication Sig Start Date End Date Taking? Authorizing Provider  amoxicillin-clavulanate (AUGMENTIN) 875-125 MG tablet Take 1 tablet by mouth every 12 (twelve) hours. 03/14/19   Quintella Reichert, MD  ciprofloxacin-dexamethasone [facility]) OTIC suspension Place 4 drops into the left ear 2 (two) times daily. Use  for seven to ten days 03/14/19   Quintella Reichert, MD  methocarbamol (ROBAXIN) 500 MG tablet Take 1 tablet (500 mg total) by mouth 2 (two) times daily. 06/13/18   Etta Quill, NP  naproxen (NAPROSYN) 375 MG tablet Take 1 tablet (375 mg total) by mouth 2 (two) times daily. 06/13/18   Etta Quill, NP  predniSONE (DELTASONE) 10 MG tablet Take 5 on day 2 (day one given in emergency department) Take 4 on day 3  Take 3 on day 4 Take 2 on day 5 Take 1 on day 6 09/25/18   Charlann Lange, PA-C    Family History No family history on file.  Social History Social History   Tobacco Use  . Smoking status: Never Smoker  . Smokeless tobacco: Never Used  Substance Use Topics  . Alcohol use: No  . Drug use: No     Allergies   Sulfa drugs cross reactors   Review of Systems Review of Systems  HENT: Positive for ear pain.   All other systems reviewed and are negative.    Physical Exam Updated Vital Signs BP 126/86 (BP Location: Right Arm)   Pulse 90   Temp 99.1 F (37.3 C) (  Oral)   Resp 16   Ht 5\' 3"  (1.6 m)   Wt 68.9 kg   LMP 02/27/2019   SpO2 100%   BMI 26.93 kg/m   Physical Exam Vitals signs and nursing note reviewed.  Constitutional:      Appearance: She is well-developed.  HENT:     Head: Normocephalic and atraumatic.     Ears:     Comments: Right ear canal with mild edema, no exudate. Right TM with erythema, dull appearance. Left ear canal with moderate edema. There is white and green exudate from the canal. Unable to visualize the left TM. No mastoid tenderness.    Mouth/Throat:     Mouth: Mucous membranes are moist.     Pharynx: No oropharyngeal exudate or posterior oropharyngeal erythema.  Cardiovascular:     Rate and Rhythm: Normal rate and regular rhythm.  Pulmonary:     Effort: Pulmonary effort is normal. No respiratory distress.  Musculoskeletal:        General: No swelling or tenderness.  Skin:    General: Skin is warm and dry.  Neurological:     Mental  Status: She is alert and oriented to person, place, and time.     Comments: No asymmetry of facial movements.  Psychiatric:        Mood and Affect: Mood normal.        Behavior: Behavior normal.      ED Treatments / Results  Labs (all labs ordered are listed, but only abnormal results are displayed) Labs Reviewed - No data to display  EKG    Radiology No results found.  Procedures Procedures (including critical care time)  Medications Ordered in ED Medications - No data to display   Initial Impression / Assessment and Plan / ED Course  I have reviewed the triage vital signs and the nursing notes.  Pertinent labs & imaging results that were available during my care of the patient were reviewed by me and considered in my medical decision making (see chart for details).        Patient here for evaluation of otalgia. She has otitis externa on examination. She is non-toxic appearing. No current concern for mastoiditis or MOE. There is significant canal edema but it does appear that there is enough space for drops to enter the canal without difficulty. Discussed with patient home care for otitis externa. Due to extensive infection will treat with drops as well is oral antibiotics. Discussed importance of outpatient follow-up and return precautions.  Final Clinical Impressions(s) / ED Diagnoses   Final diagnoses:  Acute otitis externa of left ear, unspecified type    ED Discharge Orders         Ordered    ciprofloxacin-dexamethasone (CIPRODEX) OTIC suspension  2 times daily     03/14/19 1234    amoxicillin-clavulanate (AUGMENTIN) 875-125 MG tablet  Every 12 hours     03/14/19 1234           Tilden Fossaees, Tam Savoia, MD 03/14/19 1238

## 2019-03-14 NOTE — ED Triage Notes (Signed)
Pt reports right ear pain with clear drainage x over a week, her pcp rx amox, she finished rx and now her left ear is draining yellow and is throbbing. Denies any fevers.

## 2019-07-22 ENCOUNTER — Emergency Department (HOSPITAL_COMMUNITY): Payer: No Typology Code available for payment source

## 2019-07-22 ENCOUNTER — Emergency Department (HOSPITAL_COMMUNITY)
Admission: EM | Admit: 2019-07-22 | Discharge: 2019-07-22 | Disposition: A | Discharge status: Home / Self Care | Payer: No Typology Code available for payment source | Attending: Emergency Medicine | Admitting: Emergency Medicine

## 2019-07-22 ENCOUNTER — Encounter (HOSPITAL_COMMUNITY): Payer: Self-pay | Admitting: Emergency Medicine

## 2019-07-22 ENCOUNTER — Other Ambulatory Visit: Payer: Self-pay

## 2019-07-22 DIAGNOSIS — Z20828 Contact with and (suspected) exposure to other viral communicable diseases: Secondary | ICD-10-CM | POA: Insufficient documentation

## 2019-07-22 DIAGNOSIS — R1110 Vomiting, unspecified: Secondary | ICD-10-CM | POA: Diagnosis present

## 2019-07-22 DIAGNOSIS — R197 Diarrhea, unspecified: Secondary | ICD-10-CM | POA: Diagnosis not present

## 2019-07-22 DIAGNOSIS — R112 Nausea with vomiting, unspecified: Secondary | ICD-10-CM | POA: Diagnosis not present

## 2019-07-22 DIAGNOSIS — I10 Essential (primary) hypertension: Secondary | ICD-10-CM | POA: Insufficient documentation

## 2019-07-22 LAB — COMPREHENSIVE METABOLIC PANEL
ALT: 14 U/L (ref 0–44)
AST: 15 U/L (ref 15–41)
Albumin: 3.6 g/dL (ref 3.5–5.0)
Alkaline Phosphatase: 43 U/L (ref 38–126)
Anion gap: 7 (ref 5–15)
BUN: 6 mg/dL (ref 6–20)
CO2: 27 mmol/L (ref 22–32)
Calcium: 9 mg/dL (ref 8.9–10.3)
Chloride: 108 mmol/L (ref 98–111)
Creatinine, Ser: 0.73 mg/dL (ref 0.44–1.00)
GFR calc Af Amer: >60 mL/min (ref >60)
GFR calc non Af Amer: >60 mL/min (ref >60)
Glucose, Bld: 86 mg/dL (ref 70–99)
Potassium: 3.6 mmol/L (ref 3.5–5.1)
Sodium: 142 mmol/L (ref 135–145)
Total Bilirubin: 0.3 mg/dL (ref 0.3–1.2)
Total Protein: 6.5 g/dL (ref 6.5–8.1)

## 2019-07-22 LAB — URINALYSIS, ROUTINE W REFLEX MICROSCOPIC
Bilirubin Urine: NEGATIVE
Glucose, UA: NEGATIVE mg/dL
Ketones, ur: NEGATIVE mg/dL
Leukocytes,Ua: NEGATIVE
Nitrite: NEGATIVE
Protein, ur: NEGATIVE mg/dL
Specific Gravity, Urine: 1.011 (ref 1.005–1.030)
pH: 6 (ref 5.0–8.0)

## 2019-07-22 LAB — CBC
HCT: 33.5 % — ABNORMAL LOW (ref 36.0–46.0)
Hemoglobin: 10.5 g/dL — ABNORMAL LOW (ref 12.0–15.0)
MCH: 24.5 pg — ABNORMAL LOW (ref 26.0–34.0)
MCHC: 31.3 g/dL (ref 30.0–36.0)
MCV: 78.3 fL — ABNORMAL LOW (ref 80.0–100.0)
Platelets: 315 10*3/uL (ref 150–400)
RBC: 4.28 MIL/uL (ref 3.87–5.11)
RDW: 17.2 % — ABNORMAL HIGH (ref 11.5–15.5)
WBC: 4.2 10*3/uL (ref 4.0–10.5)
nRBC: 0 % (ref 0.0–0.2)

## 2019-07-22 LAB — I-STAT BETA HCG BLOOD, ED (MC, WL, AP ONLY): I-stat hCG, quantitative: <5 m[IU]/mL (ref <5)

## 2019-07-22 LAB — LIPASE, BLOOD: Lipase: 42 U/L (ref 11–51)

## 2019-07-22 MED ORDER — PROMETHAZINE HCL 25 MG PO TABS: 25.0000 mg | ORAL_TABLET | Freq: Four times a day (QID) | ORAL | 0 refills | Status: DC | PRN

## 2019-07-22 MED ORDER — IOHEXOL 300 MG/ML  SOLN
100.0000 mL | Freq: Once | INTRAMUSCULAR | Status: AC | PRN
  Administered 2019-07-22: 100 mL via INTRAVENOUS

## 2019-07-22 MED ORDER — SODIUM CHLORIDE 0.9% FLUSH: 3.0000 mL | Freq: Once | INTRAVENOUS | Status: DC

## 2019-07-22 MED ORDER — DICYCLOMINE HCL 20 MG PO TABS: 20.0000 mg | ORAL_TABLET | Freq: Two times a day (BID) | ORAL | 0 refills | Status: DC

## 2019-07-22 MED ORDER — MORPHINE SULFATE (PF) 4 MG/ML IV SOLN
4.0000 mg | Freq: Once | INTRAVENOUS | Status: AC
  Administered 2019-07-22: 20:00:00 4 mg via INTRAVENOUS
  Filled 2019-07-22: qty 1

## 2019-07-22 MED ORDER — SODIUM CHLORIDE 0.9 % IV BOLUS
1000.0000 mL | Freq: Once | INTRAVENOUS | Status: AC
  Administered 2019-07-22: 20:00:00 1000 mL via INTRAVENOUS

## 2019-07-22 MED ORDER — ONDANSETRON HCL 4 MG/2ML IJ SOLN
4.0000 mg | Freq: Once | INTRAMUSCULAR | Status: AC
  Administered 2019-07-22: 20:00:00 4 mg via INTRAVENOUS
  Filled 2019-07-22: qty 2

## 2019-07-22 NOTE — ED Notes (Signed)
Patient transported to CT 

## 2019-07-22 NOTE — ED Triage Notes (Signed)
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  C/o diarrhea x 1 week, vomiting since last night, and generalized abd pain.

## 2019-07-22 NOTE — Discharge Instructions (Signed)
Work up in the emergency department was reassuring. I suspect your symptoms are due to a viral infection or food poisoning. Symptoms of uncomplicated viral gastrointestinal infections usually last 24-48 hours, but can linger for up to 1 week eventually diarrhea resolved.    Treatment includes symptoms control, oral hydration, prevention of spread of illness.   Promethazine (phenergan) for nausea every 6 hours for the next 24-48 hours and as needed to avoid vomiting and tolerate fluids.  Acetaminophen (tylenol) for body aches, abdominal pain. Ibuprofen (advil, aleve, motrin) can be irritating to stomach, avoid these until you feel better.  Stay well hydrated with 1-2 L of water daily.  Start with bland, mild diet avoiding hot, spicy, greasy foods until you start to feel better and then can transition to normal diet. Avoid fruit or fruit juices as these can worsen diarrhea.  Norovirus and other stomach bugs are killed by alcohol and standard cleaning agents. Wash hands often and do not share utensils or drinks to avoid spread to family members.  Return for worsening abdominal pain, localized right upper or right lower abdominal pain, chest pain, shortness of breath, inability to tolerating fluids by mouth despite nausea medication, localized abdominal pain, blood in vomit or stool, blood in your urine, burning with urination, fever  We tested your for COVID-19 (coronavirus) infection since you were having vomiting, diarrhea, abdominal pain.   COVID test results come back in 48-72 hours, sometimes sooner.  Someone will call you to notify you of results.  You can also check MyChart for formal results that will be posted.   Treatment of your illness and symptoms for now will include self-isolation, monitoring of symptoms and supportive care with over-the-counter medicines.    Stay well-hydrated. Rest. You can use over the counter medications to help with symptoms: Wash your hands often to prevent spread.   Stay hydrated with plenty of clear fluids Rest   Return to the ED if symptoms are worsening or severe, there is increased work of breathing, chest pain or shortness of breath with exertion or activity, inability to tolerate fluids due to persistent vomiting despite nausea medicines, passing out, light headedness.  If your test results are POSITIVE, the following isolation requirements need to be met to return to work and resume essential activities: At least 14 days since symptom onset  72 hours of absence of fever without antifever medicine (ibuprofen, acetaminophen). A fever is temperature of 100.33F or greater. Improvement of respiratory symptoms  If your test is NEGATIVE, you may return to work and essential activities as long as your symptoms have improved and you do not have a fever for a total of 3 days.  Call your job and notify them that your test result was negative to see if they will allow you to return to work.     Infection Prevention Recommendations for Individuals Confirmed to have, or Being Evaluated for, or have symptoms of 2019 Novel Coronavirus (COVID-19) Infection Who Receive Care at Home  Individuals who are confirmed to have, or are being evaluated for, COVID-19 should follow the prevention steps below until a healthcare provider or local or state health department says they can return to normal activities.  Stay home except to get medical care You should restrict activities outside your home, except for getting medical care. Do not go to work, school, or public areas, and do not use public transportation or taxis.  Call ahead before visiting your doctor Before your medical appointment, call the healthcare provider  and tell them that you have, or are being evaluated for, COVID-19 infection. This will help the healthcare providers office take steps to keep other people from getting infected. Ask your healthcare provider to call the local or state health  department.  Monitor your symptoms Seek prompt medical attention if your illness is worsening (e.g., difficulty breathing). Before going to your medical appointment, call the healthcare provider and tell them that you have, or are being evaluated for, COVID-19 infection. Ask your healthcare provider to call the local or state health department.  Wear a facemask You should wear a facemask that covers your nose and mouth when you are in the same room with other people and when you visit a healthcare provider. People who live with or visit you should also wear a facemask while they are in the same room with you.  Separate yourself from other people in your home As much as possible, you should stay in a different room from other people in your home. Also, you should use a separate bathroom, if available.  Avoid sharing household items You should not share dishes, drinking glasses, cups, eating utensils, towels, bedding, or other items with other people in your home. After using these items, you should wash them thoroughly with soap and water.  Cover your coughs and sneezes Cover your mouth and nose with a tissue when you cough or sneeze, or you can cough or sneeze into your sleeve. Throw used tissues in a lined trash can, and immediately wash your hands with soap and water for at least 20 seconds or use an alcohol-based hand rub.  Wash your [facility] your hands often and thoroughly with soap and water for at least 20 seconds. You can use an alcohol-based hand sanitizer if soap and water are not available and if your hands are not visibly dirty. Avoid touching your eyes, nose, and mouth with unwashed hands.   Prevention Steps for Caregivers and Household Members of Individuals Confirmed to have, or Being Evaluated for, or have symptoms of 2019 Novel Coronavirus (COVID-19) Infection Being Cared for in the Home  If you live with, or provide care at home for, a person confirmed to have, or being  evaluated for, COVID-19 infection please follow these guidelines to prevent infection:  Follow healthcare providers instructions Make sure that you understand and can help the patient follow any healthcare provider instructions for all care.  Provide for the patients basic needs You should help the patient with basic needs in the home and provide support for getting groceries, prescriptions, and other personal needs.  Monitor the patients symptoms If they are getting sicker, call his or her medical provider and tell them that the patient has, or is being evaluated for, COVID-19 infection. This will help the healthcare providers office take steps to keep other people from getting infected. Ask the healthcare provider to call the local or state health department.  Limit the number of people who have contact with the patient If possible, have only one caregiver for the patient. Other household members should stay in another home or place of residence. If this is not possible, they should stay in another room, or be separated from the patient as much as possible. Use a separate bathroom, if available. Restrict visitors who do not have an essential need to be in the home.  Keep older adults, very young children, and other sick people away from the patient Keep older adults, very young children, and those who have compromised  immune systems or chronic health conditions away from the patient. This includes people with chronic heart, lung, or kidney conditions, diabetes, and cancer.  Ensure good ventilation Make sure that shared spaces in the home have good air flow, such as from an air conditioner or an opened window, weather permitting.  Wash your hands often Wash your hands often and thoroughly with soap and water for at least 20 seconds. You can use an alcohol based hand sanitizer if soap and water are not available and if your hands are not visibly dirty. Avoid touching your eyes, nose,  and mouth with unwashed hands. Use disposable paper towels to dry your hands. If not available, use dedicated cloth towels and replace them when they become wet.  Wear a facemask and gloves Wear a disposable facemask at all times in the room and gloves when you touch or have contact with the patients blood, body fluids, and/or secretions or excretions, such as sweat, saliva, sputum, nasal mucus, vomit, urine, or feces.  Ensure the mask fits over your nose and mouth tightly, and do not touch it during use. Throw out disposable facemasks and gloves after using them. Do not reuse. Wash your hands immediately after removing your facemask and gloves. If your personal clothing becomes contaminated, carefully remove clothing and launder. Wash your hands after handling contaminated clothing. Place all used disposable facemasks, gloves, and other waste in a lined container before disposing them with other household waste. Remove gloves and wash your hands immediately after handling these items.  Do not share dishes, glasses, or other household items with the patient Avoid sharing household items. You should not share dishes, drinking glasses, cups, eating utensils, towels, bedding, or other items with a patient who is confirmed to have, or being evaluated for, COVID-19 infection. After the person uses these items, you should wash them thoroughly with soap and water.  Wash laundry thoroughly Immediately remove and wash clothes or bedding that have blood, body fluids, and/or secretions or excretions, such as sweat, saliva, sputum, nasal mucus, vomit, urine, or feces, on them. Wear gloves when handling laundry from the patient. Read and follow directions on labels of laundry or clothing items and detergent. In general, wash and dry with the warmest temperatures recommended on the label.  Clean all areas the individual has used often Clean all touchable surfaces, such as counters, tabletops, doorknobs,  bathroom fixtures, toilets, phones, keyboards, tablets, and bedside tables, every day. Also, clean any surfaces that may have blood, body fluids, and/or secretions or excretions on them. Wear gloves when cleaning surfaces the patient has come in contact with. Use a diluted bleach solution (e.g., dilute bleach with 1 part bleach and 10 parts water) or a household disinfectant with a label that says EPA-registered for coronaviruses. To make a bleach solution at home, add 1 tablespoon of bleach to 1 quart (4 cups) of water. For a larger supply, add  cup of bleach to 1 gallon (16 cups) of water. Read labels of cleaning products and follow recommendations provided on product labels. Labels contain instructions for safe and effective use of the cleaning product including precautions you should take when applying the product, such as wearing gloves or eye protection and making sure you have good ventilation during use of the product. Remove gloves and wash hands immediately after cleaning.  Monitor yourself for signs and symptoms of illness Caregivers and household members are considered close contacts, should monitor their health, and will be asked to limit movement outside of  the home to the extent possible. Follow the monitoring steps for close contacts listed on the symptom monitoring form.  ? If you have additional questions, contact your local health department or call the epidemiologist on call at [phone] (available 24/7). ? This guidance is subject to change. For the most up-to-date guidance from [facility], please refer to their website: [email or web address]

## 2019-07-22 NOTE — ED Provider Notes (Addendum)
[facility] EMERGENCY DEPARTMENT Provider Note   CSN: [id number] Arrival date & time: 07/22/19  1709     History   Chief Complaint Chief Complaint  Patient presents with  . Emesis  . Diarrhea    HPI Ebony Hansen is a 38 y.o. female presents to the ER for evaluation of diarrhea that began on Wednesday.  Reports watery, clear and green bowel movements up to 5 daily.  Has been taken Imodium as needed which does slow down the diarrhea but as soon as she eats she will have sudden urge to have a bowel movement.  Initially had mild nausea and decreased appetite only but this morning she woke up and had several episodes of nonbloody nonbilious emesis and lower abdominal crampy type pain that is constant.  Has been taking Imodium, eating rice and ginger ale and Pedialyte to stay hydrated.  She came to the ER because she developed generalized body aches, generalized weakness.  She is a [employer] manager and states a few days before her symptoms began she was notified that a few staff had been sent home due to a stomach bug.  Has been checking her temperature at home and denies any fever.  No congestion, cough, chest pain, shortness of breath.  No dysuria.  No previous abdominal surgeries.  No known previous gastroenterology issues.  No other sick contacts at home.  No recent antibiotics.  No vaginal symptoms.  LMP 10/23. No abnormal vaginal bleeding.  No concern for STD.      HPI  Past Medical History:  Diagnosis Date  . ADHD (attention deficit hyperactivity disorder)   . Anxiety   . Chlamydia   . Hypertension   . Sickle cell trait (HCC)     There are no active problems to display for this patient.   Past Surgical History:  Procedure Laterality Date  . CESAREAN SECTION    . WISDOM TOOTH EXTRACTION       OB History    Gravida  6   Para  2   Term  2   Preterm      AB  3   Living  2     SAB  2   TAB  1   Ectopic      Multiple      Live  Births               Home Medications    Prior to Admission medications   Medication Sig Start Date End Date Taking? Authorizing Provider  amoxicillin-clavulanate (AUGMENTIN) 875-125 MG tablet Take 1 tablet by mouth every 12 (twelve) hours. 03/14/19   Tilden Fossa, MD  ciprofloxacin-dexamethasone [facility]) OTIC suspension Place 4 drops into the left ear 2 (two) times daily. Use for seven to ten days 03/14/19   Tilden Fossa, MD  dicyclomine (BENTYL) 20 MG tablet Take 1 tablet (20 mg total) by mouth 2 (two) times daily. 07/22/19   Liberty Handy, PA-C  methocarbamol (ROBAXIN) 500 MG tablet Take 1 tablet (500 mg total) by mouth 2 (two) times daily. 06/13/18   Felicie Morn, NP  naproxen (NAPROSYN) 375 MG tablet Take 1 tablet (375 mg total) by mouth 2 (two) times daily. 06/13/18   Felicie Morn, NP  predniSONE (DELTASONE) 10 MG tablet Take 5 on day 2 (day one given in emergency department) Take 4 on day 3  Take 3 on day 4 Take 2 on day 5 Take 1 on day 6 09/25/18   Upstill,  Nehemiah Settle, PA-C  promethazine (PHENERGAN) 25 MG tablet Take 1 tablet (25 mg total) by mouth every 6 (six) hours as needed for nausea or vomiting. 07/22/19   Kinnie Feil, PA-C    Family History No family history on file.  Social History Social History   Tobacco Use  . Smoking status: Never Smoker  . Smokeless tobacco: Never Used  Substance Use Topics  . Alcohol use: No  . Drug use: No     Allergies   Sulfa drugs cross reactors   Review of Systems Review of Systems  Constitutional: Positive for fatigue.  Gastrointestinal: Positive for abdominal pain, diarrhea, nausea and vomiting.  Neurological: Positive for weakness (generalized).  All other systems reviewed and are negative.    Physical Exam Updated Vital Signs BP 121/77 (BP Location: Right Arm)   Pulse 64   Temp 98.4 F (36.9 C) (Oral)   Resp 17   LMP 07/13/2019   SpO2 98%   Physical Exam Vitals signs and nursing note reviewed.   Constitutional:      Appearance: She is well-developed.     Comments: Non toxic in NAD  HENT:     Head: Normocephalic and atraumatic.     Nose: Nose normal.  Eyes:     Conjunctiva/sclera: Conjunctivae normal.  Neck:     Musculoskeletal: Normal range of motion.  Cardiovascular:     Rate and Rhythm: Normal rate and regular rhythm.  Pulmonary:     Effort: Pulmonary effort is normal.     Breath sounds: Normal breath sounds.  Abdominal:     General: Bowel sounds are normal.     Palpations: Abdomen is soft.     Tenderness: There is abdominal tenderness. There is guarding.     Comments: Focal moderate RLQ tenderness, patient jumps up from bed with palpation. Right CVAT.  No G/R/R. No suprapubic or CVA tenderness. Negative Murphy's. Active BS to lower quadrants.   Musculoskeletal: Normal range of motion.  Skin:    General: Skin is warm and dry.     Capillary Refill: Capillary refill takes less than 2 seconds.  Neurological:     Mental Status: She is alert.  Psychiatric:        Behavior: Behavior normal.      ED Treatments / Results  Labs (all labs ordered are listed, but only abnormal results are displayed) Labs Reviewed  CBC - Abnormal; Notable for the following components:      Result Value   Hemoglobin 10.5 (*)    HCT 33.5 (*)    MCV 78.3 (*)    MCH 24.5 (*)    RDW 17.2 (*)    All other components within normal limits  URINALYSIS, ROUTINE W REFLEX MICROSCOPIC - Abnormal; Notable for the following components:   APPearance HAZY (*)    Hgb urine dipstick SMALL (*)    Bacteria, UA RARE (*)    All other components within normal limits  GASTROINTESTINAL PANEL BY PCR, STOOL (REPLACES STOOL CULTURE)  NOVEL CORONAVIRUS, NAA (HOSP ORDER, SEND-OUT TO REF LAB; TAT 18-24 HRS)  LIPASE, BLOOD  COMPREHENSIVE METABOLIC PANEL  I-STAT BETA HCG BLOOD, ED ([facility], [facility], [facility] ONLY)    EKG None  Radiology Ct Abdomen Pelvis W Contrast  Result Date: 07/22/2019 CLINICAL DATA:  Vomiting,  diarrhea.  Right lower quadrant pain. EXAM: CT ABDOMEN AND PELVIS WITH CONTRAST TECHNIQUE: Multidetector CT imaging of the abdomen and pelvis was performed using the standard protocol following bolus administration of intravenous contrast. CONTRAST:  OMNIPAQUE IOHEXOL 300 MG/ML  SOLN COMPARISON:  None. FINDINGS: Lower chest: Lung bases are clear. No effusions. Heart is normal size. Hepatobiliary: No focal hepatic abnormality. Gallbladder unremarkable. Pancreas: No focal abnormality or ductal dilatation. Spleen: No focal abnormality.  Normal size. Adrenals/Urinary Tract: No adrenal abnormality. No focal renal abnormality. No stones or hydronephrosis. Urinary bladder is unremarkable. Stomach/Bowel: Normal appendix. Stomach, large and small bowel grossly unremarkable. Vascular/Lymphatic: No evidence of aneurysm or adenopathy. Reproductive: Uterus and adnexa unremarkable.  No mass. Other: Small amount of free fluid in the pelvis.  No free air. Musculoskeletal: No acute bony abnormality. IMPRESSION: No acute findings in the abdomen or pelvis. Normal appendix. Electronically Signed   By: Charlett Nose M.D.   On: 07/22/2019 20:37    Procedures Procedures (including critical care time)  Medications Ordered in ED Medications  sodium chloride flush (NS) 0.9 % injection 3 mL (has no administration in time range)  morphine 4 MG/ML injection 4 mg (4 mg Intravenous Given 07/22/19 1957)  sodium chloride 0.9 % bolus 1,000 mL (0 mLs Intravenous Stopped 07/22/19 2141)  ondansetron (ZOFRAN) injection 4 mg (4 mg Intravenous Given 07/22/19 1957)  iohexol (OMNIPAQUE) 300 MG/ML solution 100 mL (100 mLs Intravenous Contrast Given 07/22/19 2009)     Initial Impression / Assessment and Plan / ED Course  I have reviewed the triage vital signs and the nursing notes.  Pertinent labs & imaging results that were available during my care of the patient were reviewed by me and considered in my medical decision making (see chart  for details).  Clinical Course as of Jul 21 2240  Wynelle Link Jul 22, 2019  1935 Hemoglobin(!): 10.5 [CG]    Clinical Course User Index [CG] Jerrell Mylar   EMR reviewed.  DDx includes viral gastroenteritis, right-sided diverticulitis, appendicitis.  I considered UTI, renal stone unlikely since she has no urinary symptoms however she has right CVA tenderness and right lower quadrant tenderness which could be a passing stone although diarrhea makes this very unlikely.  Known sick contacts at work.  I doubt SBO, cholecystitis, pancreatitis.  Your work-up initiated in triage reviewed by me remarkable for mild anemia which patient states is chronic.  UA without significant infection markers.  No RBCs.  CT A/P obtained given persistent right lower quadrant tenderness to rule out appendicitis and right-sided diverticulitis.  2149: CT A/P interpreted and reviewed by me, normal.  Patient reevaluated and she feels much better after fluids and morphine.  Discussed work-up is benign and high suspicion for a viral process.  She was unable to provide a stool sample for testing.  Recommended symptomatic management including antiemeitc, oral fluid, probiotics.  Return precautions discussed.  She is comfortable with this.  Given symptoms including nausea, vomiting, diarrhea, abdominal pain and occupation, we will do a send out COVID-19 test here.  Final Clinical Impressions(s) / ED Diagnoses   Final diagnoses:  Nausea vomiting and diarrhea    ED Discharge Orders         Ordered    promethazine (PHENERGAN) 25 MG tablet  Every 6 hours PRN,   Status:  Discontinued     07/22/19 2150    dicyclomine (BENTYL) 20 MG tablet  2 times daily,   Status:  Discontinued     07/22/19 2153    dicyclomine (BENTYL) 20 MG tablet  2 times daily     07/22/19 2241    promethazine (PHENERGAN) 25 MG tablet  Every 6 hours PRN  07/22/19 2241             Liberty HandyGibbons, [place] J, PA-C 07/22/19 2153      Liberty HandyGibbons,  Lynann Demetrius J, PA-C 07/22/19 2241    Jacalyn LefevreHaviland, Julie, MD 07/22/19 [phone]

## 2019-07-22 NOTE — ED Notes (Signed)
Pt given cup to provide urine sample 

## 2019-07-24 LAB — NOVEL CORONAVIRUS, NAA (HOSP ORDER, SEND-OUT TO REF LAB; TAT 18-24 HRS): SARS-CoV-2, NAA: NOT DETECTED

## 2019-07-26 ENCOUNTER — Observation Stay (HOSPITAL_COMMUNITY)
Admission: EM | Admit: 2019-07-26 | Discharge: 2019-07-27 | Disposition: A | Discharge status: Home / Self Care | Payer: No Typology Code available for payment source | Attending: Internal Medicine | Admitting: Internal Medicine

## 2019-07-26 ENCOUNTER — Emergency Department (HOSPITAL_COMMUNITY): Payer: No Typology Code available for payment source

## 2019-07-26 ENCOUNTER — Encounter (HOSPITAL_COMMUNITY): Payer: Self-pay | Admitting: Emergency Medicine

## 2019-07-26 ENCOUNTER — Other Ambulatory Visit: Payer: Self-pay

## 2019-07-26 DIAGNOSIS — Z20828 Contact with and (suspected) exposure to other viral communicable diseases: Secondary | ICD-10-CM | POA: Insufficient documentation

## 2019-07-26 DIAGNOSIS — J039 Acute tonsillitis, unspecified: Secondary | ICD-10-CM | POA: Diagnosis not present

## 2019-07-26 DIAGNOSIS — Z91013 Allergy to seafood: Secondary | ICD-10-CM | POA: Insufficient documentation

## 2019-07-26 DIAGNOSIS — I10 Essential (primary) hypertension: Secondary | ICD-10-CM | POA: Insufficient documentation

## 2019-07-26 DIAGNOSIS — R05 Cough: Secondary | ICD-10-CM | POA: Diagnosis present

## 2019-07-26 DIAGNOSIS — D509 Iron deficiency anemia, unspecified: Secondary | ICD-10-CM | POA: Diagnosis not present

## 2019-07-26 DIAGNOSIS — M791 Myalgia, unspecified site: Secondary | ICD-10-CM | POA: Insufficient documentation

## 2019-07-26 DIAGNOSIS — D573 Sickle-cell trait: Secondary | ICD-10-CM | POA: Insufficient documentation

## 2019-07-26 DIAGNOSIS — Z79899 Other long term (current) drug therapy: Secondary | ICD-10-CM | POA: Diagnosis not present

## 2019-07-26 DIAGNOSIS — R0602 Shortness of breath: Secondary | ICD-10-CM

## 2019-07-26 DIAGNOSIS — Z791 Long term (current) use of non-steroidal anti-inflammatories (NSAID): Secondary | ICD-10-CM | POA: Diagnosis not present

## 2019-07-26 DIAGNOSIS — R059 Cough, unspecified: Secondary | ICD-10-CM

## 2019-07-26 DIAGNOSIS — Z23 Encounter for immunization: Secondary | ICD-10-CM | POA: Diagnosis not present

## 2019-07-26 DIAGNOSIS — R061 Stridor: Secondary | ICD-10-CM | POA: Diagnosis present

## 2019-07-26 DIAGNOSIS — F909 Attention-deficit hyperactivity disorder, unspecified type: Secondary | ICD-10-CM | POA: Insufficient documentation

## 2019-07-26 DIAGNOSIS — Z882 Allergy status to sulfonamides status: Secondary | ICD-10-CM | POA: Diagnosis not present

## 2019-07-26 DIAGNOSIS — F419 Anxiety disorder, unspecified: Secondary | ICD-10-CM | POA: Diagnosis not present

## 2019-07-26 LAB — BASIC METABOLIC PANEL
Anion gap: 8 (ref 5–15)
BUN: 7 mg/dL (ref 6–20)
CO2: 24 mmol/L (ref 22–32)
Calcium: 8.7 mg/dL — ABNORMAL LOW (ref 8.9–10.3)
Chloride: 106 mmol/L (ref 98–111)
Creatinine, Ser: 0.67 mg/dL (ref 0.44–1.00)
GFR calc Af Amer: >60 mL/min (ref >60)
GFR calc non Af Amer: >60 mL/min (ref >60)
Glucose, Bld: 101 mg/dL — ABNORMAL HIGH (ref 70–99)
Potassium: 3.7 mmol/L (ref 3.5–5.1)
Sodium: 138 mmol/L (ref 135–145)

## 2019-07-26 LAB — CBC WITH DIFFERENTIAL/PLATELET
Abs Immature Granulocytes: 0 10*3/uL (ref 0.00–0.07)
Basophils Absolute: 0 10*3/uL (ref 0.0–0.1)
Basophils Relative: 1 %
Eosinophils Absolute: 0.1 10*3/uL (ref 0.0–0.5)
Eosinophils Relative: 3 %
HCT: 31.4 % — ABNORMAL LOW (ref 36.0–46.0)
Hemoglobin: 10 g/dL — ABNORMAL LOW (ref 12.0–15.0)
Immature Granulocytes: 0 %
Lymphocytes Relative: 34 %
Lymphs Abs: 1.4 10*3/uL (ref 0.7–4.0)
MCH: 24.4 pg — ABNORMAL LOW (ref 26.0–34.0)
MCHC: 31.8 g/dL (ref 30.0–36.0)
MCV: 76.8 fL — ABNORMAL LOW (ref 80.0–100.0)
Monocytes Absolute: 0.4 10*3/uL (ref 0.1–1.0)
Monocytes Relative: 10 %
Neutro Abs: 2.1 10*3/uL (ref 1.7–7.7)
Neutrophils Relative %: 52 %
Platelets: 255 10*3/uL (ref 150–400)
RBC: 4.09 MIL/uL (ref 3.87–5.11)
RDW: 17.2 % — ABNORMAL HIGH (ref 11.5–15.5)
WBC: 4 10*3/uL (ref 4.0–10.5)
nRBC: 0 % (ref 0.0–0.2)

## 2019-07-26 LAB — RESPIRATORY PANEL BY PCR

## 2019-07-26 LAB — I-STAT BETA HCG BLOOD, ED (MC, WL, AP ONLY): I-stat hCG, quantitative: <5 m[IU]/mL (ref <5)

## 2019-07-26 LAB — GROUP A STREP BY PCR: Group A Strep by PCR: NOT DETECTED

## 2019-07-26 LAB — SARS CORONAVIRUS 2 (TAT 6-24 HRS): SARS Coronavirus 2: NEGATIVE

## 2019-07-26 LAB — HIV ANTIBODY (ROUTINE TESTING W REFLEX): HIV Screen 4th Generation wRfx: NONREACTIVE

## 2019-07-26 MED ORDER — DEXAMETHASONE SODIUM PHOSPHATE 10 MG/ML IJ SOLN: 10.0000 mg | Freq: Once | INTRAMUSCULAR | Status: DC

## 2019-07-26 MED ORDER — RACEPINEPHRINE HCL 2.25 % IN NEBU
0.5000 mL | INHALATION_SOLUTION | RESPIRATORY_TRACT | Status: DC | PRN
  Filled 2019-07-26: qty 0.5

## 2019-07-26 MED ORDER — RACEPINEPHRINE HCL 2.25 % IN NEBU
0.5000 mL | INHALATION_SOLUTION | Freq: Once | RESPIRATORY_TRACT | Status: AC
  Administered 2019-07-26: 14:00:00 0.5 mL via RESPIRATORY_TRACT
  Filled 2019-07-26: qty 0.5

## 2019-07-26 MED ORDER — AMOXICILLIN-POT CLAVULANATE 875-125 MG PO TABS
1.0000 | ORAL_TABLET | Freq: Once | ORAL | Status: AC
  Administered 2019-07-26: 14:00:00 1 via ORAL
  Filled 2019-07-26: qty 1

## 2019-07-26 MED ORDER — HYDROCODONE-ACETAMINOPHEN 5-325 MG PO TABS
1.0000 | ORAL_TABLET | Freq: Four times a day (QID) | ORAL | Status: DC | PRN
  Administered 2019-07-26: 16:00:00 1 via ORAL
  Filled 2019-07-26: qty 1

## 2019-07-26 MED ORDER — DEXAMETHASONE SODIUM PHOSPHATE 10 MG/ML IJ SOLN
10.0000 mg | Freq: Once | INTRAMUSCULAR | Status: AC
  Administered 2019-07-26: 09:00:00 10 mg via INTRAVENOUS
  Filled 2019-07-26: qty 1

## 2019-07-26 MED ORDER — ENOXAPARIN SODIUM 40 MG/0.4ML ~~LOC~~ SOLN
40.0000 mg | SUBCUTANEOUS | Status: DC
  Administered 2019-07-26: 20:00:00 40 mg via SUBCUTANEOUS
  Filled 2019-07-26: qty 0.4

## 2019-07-26 MED ORDER — GUAIFENESIN 100 MG/5ML PO SOLN
10.0000 mL | Freq: Once | ORAL | Status: AC
  Administered 2019-07-26: 200 mg via ORAL
  Filled 2019-07-26: qty 10

## 2019-07-26 MED ORDER — BENZONATATE 100 MG PO CAPS: 200.0000 mg | ORAL_CAPSULE | Freq: Three times a day (TID) | ORAL | Status: DC | PRN

## 2019-07-26 MED ORDER — AMOXICILLIN-POT CLAVULANATE 875-125 MG PO TABS: 1.0000 | ORAL_TABLET | Freq: Two times a day (BID) | ORAL | Status: DC

## 2019-07-26 MED ORDER — FAMOTIDINE 20 MG PO TABS
20.0000 mg | ORAL_TABLET | Freq: Every day | ORAL | Status: DC
  Administered 2019-07-26 – 2019-07-27 (×2): 20 mg via ORAL
  Filled 2019-07-26 (×2): qty 1

## 2019-07-26 MED ORDER — RACEPINEPHRINE HCL 2.25 % IN NEBU
0.5000 mL | INHALATION_SOLUTION | Freq: Once | RESPIRATORY_TRACT | Status: AC
  Administered 2019-07-26: 0.5 mL via RESPIRATORY_TRACT
  Filled 2019-07-26: qty 0.5

## 2019-07-26 MED ORDER — IOHEXOL 300 MG/ML  SOLN
75.0000 mL | Freq: Once | INTRAMUSCULAR | Status: AC | PRN
  Administered 2019-07-26: 75 mL via INTRAVENOUS

## 2019-07-26 MED ORDER — ONDANSETRON HCL 4 MG/2ML IJ SOLN
4.0000 mg | Freq: Four times a day (QID) | INTRAMUSCULAR | Status: DC | PRN
  Filled 2019-07-26: qty 2

## 2019-07-26 MED ORDER — ONDANSETRON HCL 4 MG PO TABS: 4.0000 mg | ORAL_TABLET | Freq: Four times a day (QID) | ORAL | Status: DC | PRN

## 2019-07-26 MED ORDER — MENTHOL 3 MG MT LOZG
1.0000 | LOZENGE | OROMUCOSAL | Status: DC | PRN
  Filled 2019-07-26: qty 9

## 2019-07-26 MED ORDER — ONDANSETRON HCL 4 MG/2ML IJ SOLN
4.0000 mg | Freq: Once | INTRAMUSCULAR | Status: AC
  Administered 2019-07-26: 14:00:00 4 mg via INTRAVENOUS
  Filled 2019-07-26: qty 2

## 2019-07-26 MED ORDER — ACETAMINOPHEN 650 MG RE SUPP: 650.0000 mg | Freq: Four times a day (QID) | RECTAL | Status: DC | PRN

## 2019-07-26 MED ORDER — LIDOCAINE VISCOUS HCL 2 % MT SOLN
15.0000 mL | Freq: Once | OROMUCOSAL | Status: AC
  Administered 2019-07-26: 13:00:00 15 mL via OROMUCOSAL
  Filled 2019-07-26: qty 15

## 2019-07-26 MED ORDER — ACETAMINOPHEN 325 MG PO TABS
650.0000 mg | ORAL_TABLET | Freq: Four times a day (QID) | ORAL | Status: DC | PRN
  Administered 2019-07-26 – 2019-07-27 (×2): 650 mg via ORAL
  Filled 2019-07-26 (×2): qty 2

## 2019-07-26 MED ORDER — CALCIUM GLUCONATE-NACL 1-0.675 GM/50ML-% IV SOLN
1.0000 g | Freq: Once | INTRAVENOUS | Status: AC
  Administered 2019-07-26: 1000 mg via INTRAVENOUS
  Filled 2019-07-26: qty 50

## 2019-07-26 NOTE — ED Notes (Signed)
Patient transported to CT 

## 2019-07-26 NOTE — ED Provider Notes (Signed)
[facility] EMERGENCY DEPARTMENT Provider Note   CSN: [id number] Arrival date & time: 07/26/19  4650     History   Chief Complaint Chief Complaint  Patient presents with  . Cough    HPI Ebony Hansen is a 38 y.o. female.     Ebony Hansen is a 38 y.o. female With a history of hypertension and sickle cell trait, who presents to the emergency department for evaluation of cough and wheezing.  Symptoms started yesterday.  Patient reports that she was recently seen in the ED on 11/1 for vomiting and diarrhea, had CT scan and lab work that was overall reassuring and this was thought to be a viral illness, she had a Covid test performed which returned negative and denies any known Covid exposure.  Reports that vomiting and diarrhea have been improving but yesterday she developed a persistent dry cough.  She reports that she noticed some wheezing associated with this that was not improving with albuterol, but denies chest tightness.  She has had some postnasal drip and bilateral ear pain, but denies sore throat.  Denies any fevers.  Has not had any additional vomiting and diarrhea has been improving.  No neck pain or stiffness.  No medications prior to arrival aside from albuterol, and no other aggravating or alleviating factors.     Past Medical History:  Diagnosis Date  . ADHD (attention deficit hyperactivity disorder)   . Anxiety   . Chlamydia   . Hypertension   . Sickle cell trait (HCC)     There are no active problems to display for this patient.   Past Surgical History:  Procedure Laterality Date  . CESAREAN SECTION    . WISDOM TOOTH EXTRACTION       OB History    Gravida  6   Para  2   Term  2   Preterm      AB  3   Living  2     SAB  2   TAB  1   Ectopic      Multiple      Live Births               Home Medications    Prior to Admission medications   Medication Sig Start Date End Date Taking? Authorizing Provider   amoxicillin-clavulanate (AUGMENTIN) 875-125 MG tablet Take 1 tablet by mouth every 12 (twelve) hours. 03/14/19   Tilden Fossa, MD  ciprofloxacin-dexamethasone [facility]) OTIC suspension Place 4 drops into the left ear 2 (two) times daily. Use for seven to ten days 03/14/19   Tilden Fossa, MD  dicyclomine (BENTYL) 20 MG tablet Take 1 tablet (20 mg total) by mouth 2 (two) times daily. 07/22/19   Liberty Handy, PA-C  methocarbamol (ROBAXIN) 500 MG tablet Take 1 tablet (500 mg total) by mouth 2 (two) times daily. 06/13/18   Felicie Morn, NP  naproxen (NAPROSYN) 375 MG tablet Take 1 tablet (375 mg total) by mouth 2 (two) times daily. 06/13/18   Felicie Morn, NP  predniSONE (DELTASONE) 10 MG tablet Take 5 on day 2 (day one given in emergency department) Take 4 on day 3  Take 3 on day 4 Take 2 on day 5 Take 1 on day 6 09/25/18   Upstill, Melvenia Beam, PA-C  promethazine (PHENERGAN) 25 MG tablet Take 1 tablet (25 mg total) by mouth every 6 (six) hours as needed for nausea or vomiting. 07/22/19   Liberty Handy, PA-C  Family History No family history on file.  Social History Social History   Tobacco Use  . Smoking status: Never Smoker  . Smokeless tobacco: Never Used  Substance Use Topics  . Alcohol use: No  . Drug use: No     Allergies   Sulfa drugs cross reactors   Review of Systems Review of Systems  Constitutional: Negative for chills and fever.  HENT: Positive for congestion, ear pain and rhinorrhea.   Respiratory: Positive for cough and wheezing. Negative for shortness of breath.   Cardiovascular: Negative for chest pain.  Gastrointestinal: Negative for abdominal pain, diarrhea, nausea and vomiting.  Genitourinary: Negative for dysuria.  Musculoskeletal: Negative for arthralgias and myalgias.  Skin: Negative for color change and rash.  Neurological: Negative for dizziness, syncope and light-headedness.     Physical Exam Updated Vital Signs BP (!) 157/94 (BP Location:  Right Arm)   Pulse 98   Temp 98.7 F (37.1 C) (Oral)   Resp (!) 24   LMP 07/13/2019 Comment: Negative HCG in ED  SpO2 100%   Physical Exam Vitals signs and nursing note reviewed.  Constitutional:      General: She is not in acute distress.    Appearance: Normal appearance. She is well-developed and normal weight. She is not ill-appearing or diaphoretic.     Comments: Patient is in no acute distress.  HENT:     Head: Normocephalic and atraumatic.     Right Ear: Tympanic membrane and ear canal normal.     Left Ear: Tympanic membrane and ear canal normal.     Mouth/Throat:     Mouth: Mucous membranes are moist.     Pharynx: Oropharynx is clear.     Comments: Posterior oropharynx is clear and moist, there is mild erythema but no significant swelling, no tonsillar exudates or edema, uvula is midline.  Patient tolerating secretions without difficulty, with normal phonation. Eyes:     General:        Right eye: No discharge.        Left eye: No discharge.  Neck:     Comments: Stridor noted and is audible without the use of stethoscope.  Patient without tenderness or crepitus over the anterior neck.  No palpable lymphadenopathy. Cardiovascular:     Rate and Rhythm: Normal rate and regular rhythm.     Heart sounds: Normal heart sounds. No murmur. No friction rub. No gallop.   Pulmonary:     Effort: Pulmonary effort is normal. No respiratory distress.     Breath sounds: Stridor present.     Comments: Respirations are equal and unlabored, no respiratory distress noted.  Stridor is audible upon entering the room but when auscultating over the lungs there is no appreciable wheezing, rhonchi or rales, and good air movement. Musculoskeletal:        General: No deformity.  Skin:    General: Skin is warm and dry.  Neurological:     Mental Status: She is alert and oriented to person, place, and time.     Coordination: Coordination normal.     Comments: Speech is clear, able to follow commands  Moves extremities without ataxia, coordination intact  Psychiatric:        Mood and Affect: Mood normal.        Behavior: Behavior normal.      ED Treatments / Results  Labs (all labs ordered are listed, but only abnormal results are displayed) Labs Reviewed  BASIC METABOLIC PANEL - Abnormal; Notable for the  following components:      Result Value   Glucose, Bld 101 (*)    Calcium 8.7 (*)    All other components within normal limits  CBC WITH DIFFERENTIAL/PLATELET - Abnormal; Notable for the following components:   Hemoglobin 10.0 (*)    HCT 31.4 (*)    MCV 76.8 (*)    MCH 24.4 (*)    RDW 17.2 (*)    All other components within normal limits  GROUP A STREP BY PCR  SARS CORONAVIRUS 2 (TAT 6-24 HRS)  RESPIRATORY PANEL BY PCR  HIV ANTIBODY (ROUTINE TESTING W REFLEX)  CBC  BASIC METABOLIC PANEL  I-STAT BETA HCG BLOOD, ED ([facility], [facility], [facility] ONLY)    EKG None  Radiology Dg Neck Soft Tissue  Result Date: 07/26/2019 CLINICAL DATA:  Stridor, wheezing, dyspnea EXAM: NECK SOFT TISSUES - 1+ VIEW COMPARISON:  None. FINDINGS: There is no evidence of retropharyngeal soft tissue swelling or epiglottic enlargement. The cervical airway is unremarkable and no radio-opaque foreign body identified. IMPRESSION: Negative. Electronically Signed   By: Delbert PhenixJason A Poff M.D.   On: 07/26/2019 09:50   Dg Chest 2 View  Result Date: 07/26/2019 CLINICAL DATA:  Shortness of breath EXAM: CHEST - 2 VIEW COMPARISON:  11/08/2017 FINDINGS: Normal heart size and mediastinal contours. No acute infiltrate or edema. No effusion or pneumothorax. No acute osseous findings. IMPRESSION: No active cardiopulmonary disease. Electronically Signed   By: Marnee SpringJonathon  Watts M.D.   On: 07/26/2019 09:44   Ct Soft Tissue Neck W Contrast  Result Date: 07/26/2019 CLINICAL DATA:  Sore throat/stridor, epiglottitis or tonsillitis suspected. Additional history provided: Dry cough, sore throat that began yesterday. EXAM: CT NECK WITH CONTRAST  TECHNIQUE: Multidetector CT imaging of the neck was performed using the standard protocol following the bolus administration of intravenous contrast. CONTRAST:  75mL OMNIPAQUE IOHEXOL 300 MG/ML  SOLN COMPARISON:  Radiograph of the neck soft tissues performed earlier the same day 07/26/2011 FINDINGS: Pharynx and larynx: Poor dentition with multiple carious teeth. There is mild asymmetric prominence of the right palatine tonsil and of soft tissue within the right glossotonsillar sulcus. No evidence of peritonsillar abscess. No convincing evidence of epiglottitis on the current exam. No appreciable mass or swelling within the larynx. No airway compromise. Salivary glands: No evidence of inflammation, mass or stone. Thyroid: Negative Lymph nodes: No pathologically enlarged cervical chain lymph nodes. Vascular: The major vascular structures of the neck appear patent. Limited intracranial: No abnormality identified Visualized orbits: No evidence of acute abnormality. Mastoids and visualized paranasal sinuses: No significant paranasal sinus disease or mastoid effusion at the imaged levels. Skeleton: No acute abnormality or suspicious osseous lesion. Upper chest: No consolidation within the imaged lung apices. IMPRESSION: Mild asymmetric prominence of the right palatine tonsil and of soft tissue within the right glossotonsillar sulcus. Findings may reflect sequela of tonsillitis given provided history. However, close clinical follow-up recommended with repeat imaging if warranted to exclude alternative etiologies (i.e. Malignancy). No evidence of peritonsillar abscess. No significant airway compromise. Electronically Signed   By: Jackey LogeKyle  Golden DO   On: 07/26/2019 13:06    Procedures Procedures (including critical care time)  Medications Ordered in ED Medications  Racepinephrine HCl 2.25 % nebulizer solution 0.5 mL (0.5 mLs Nebulization Given 07/26/19 0841)  dexamethasone (DECADRON) injection 10 mg (10 mg Intravenous  Given 07/26/19 0905)  iohexol (OMNIPAQUE) 300 MG/ML solution 75 mL (75 mLs Intravenous Contrast Given 07/26/19 1248)  lidocaine (XYLOCAINE) 2 % viscous mouth solution 15 mL (15 mLs  Mouth/Throat Given 07/26/19 1310)  Racepinephrine HCl 2.25 % nebulizer solution 0.5 mL (0.5 mLs Nebulization Given 07/26/19 1336)  amoxicillin-clavulanate (AUGMENTIN) 875-125 MG per tablet 1 tablet (1 tablet Oral Given 07/26/19 1331)  guaiFENesin (ROBITUSSIN) 100 MG/5ML solution 200 mg (200 mg Oral Given 07/26/19 1348)  ondansetron (ZOFRAN) injection 4 mg (4 mg Intravenous Given 07/26/19 1330)     Initial Impression / Assessment and Plan / ED Course  I have reviewed the triage vital signs and the nursing notes.  Pertinent labs & imaging results that were available during my care of the patient were reviewed by me and considered in my medical decision making (see chart for details).  38 year old female presenting with cough noted to be stridorous on initial evaluation.  Persistent stridor noted upon entering the room, although patient is not in acute distress, no hypoxia.  She denies sore throat initially but later reported that while being here in the emergency department she has had some moderate sore throat.  Was recently seen for GI symptoms and tested negative for Covid.  Has had cough and thought that stridor was wheezing at home but this did not respond to albuterol.  Patient given racemic epinephrine and Decadron., chest x-ray and soft tissue neck x-ray ordered as well as basic labs.  Labs show no leukocytosis, hemoglobin stable at 10, no significant electrolyte derangements.  Negative pregnancy.  Negative strep. Covid test pending.  Chest x-ray and x-ray of the soft tissue neck are unremarkable, but given significant stridor will proceed with CT soft tissue neck to evaluate for epiglottitis or foreign body.  CT scan is negative for epiglottitis, shows mild right-sided tonsillitis, on reevaluation patient has had  recurrence of stridor, I am concerned that despite patient's reassuring imaging with recurrent stridor she will likely need observation, these episodes tend to be associated with coughing, patient could potentially be having subsequent laryngeal spasm.  Will give additional racemic epi, antibiotics for tonsillitis.  Covid test is pending.  Case discussed with Dr. Katrinka Blazing with [facility] hospitalist who will see and admit the patient for observation.  Final Clinical Impressions(s) / ED Diagnoses   Final diagnoses:  Stridor  Tonsillitis    ED Discharge Orders    None       Dartha Lodge, [place] 07/26/19 1717    Glynn Octave, MD 07/27/19 [phone]

## 2019-07-26 NOTE — ED Notes (Signed)
RT at bedside.

## 2019-07-26 NOTE — H&P (Signed)
History and Physical    Ebony Hansen:[id number] DOB: 1981-08-03 DOA: 07/26/2019  Referring MD/NP/PA: Carrolyn Meiers, PA-C PCP: Eunice Blase, PA-C  Patient coming from: home  Chief Complaint: Cough and shortness of breath  I have personally briefly reviewed patient's old medical records in [facility]   HPI: Ebony Hansen is a 38 y.o. female with medical history significant of hypertension, sickle cell trait, and anxiety presented with complaints of a dry cough and shortness of breath starting yesterday.  Symptoms initially started 8 days ago with reports of watery diarrhea with nausea and diarrhea.  She works as a Aeronautical engineer members had come down with similar symptoms.  Evaluated in the emergency department 4 days ago for the symptoms, but had negative work-up including CT scan of the abdomen and pelvis.  Patient was given fluids and ultimately discharged home.  Her Covid screening test was negative.  Since that time she reports that the nausea, vomiting, and diarrhea have subsided.  She started coughing yesterday and reported having difficulty sleeping due to feeling as though she was being smothered.  This morning she began to have some sore throat, but secondary to her cough.  ED Course: Patient was noted to be afebrile and tachypneic with blood pressures elevated up to 152/96. CT scan of the neck revealed signs of asymmetric swelling concerning for sequela of recent tonsillitis.  Group A strep by PCR was not detected.  Patient has been given 10 mg of Decadron, amoxicillin, and racemic epinephrine nebulized twice on the ED.  TRH called to admit for observation overnight due to recurrent stridor.  Review of Systems  Constitutional: Positive for malaise/fatigue. Negative for chills and fever.  HENT: Positive for sore throat.   Eyes: Negative for photophobia and pain.  Respiratory: Positive for shortness of breath and stridor.   Cardiovascular: Negative  for leg swelling.  Gastrointestinal: Negative for nausea and vomiting.  Genitourinary: Negative for dysuria and hematuria.  Musculoskeletal: Positive for myalgias.  Neurological: Negative for focal weakness and loss of consciousness.  Endo/Heme/Allergies: Positive for environmental allergies.  Psychiatric/Behavioral: Negative for memory loss and substance abuse. The patient has insomnia.     Past Medical History:  Diagnosis Date   ADHD (attention deficit hyperactivity disorder)    Anxiety    Chlamydia    Hypertension    Sickle cell trait (HCC)     Past Surgical History:  Procedure Laterality Date   CESAREAN SECTION     WISDOM TOOTH EXTRACTION       reports that she has never smoked. She has never used smokeless tobacco. She reports that she does not drink alcohol or use drugs.  Allergies  Allergen Reactions   Sulfa Drugs Cross Reactors Anaphylaxis and Other (See Comments)    Causes drooling.    No family history on file.  Prior to Admission medications   Medication Sig Start Date End Date Taking? Authorizing Provider  amoxicillin-clavulanate (AUGMENTIN) 875-125 MG tablet Take 1 tablet by mouth every 12 (twelve) hours. 03/14/19   Tilden Fossa, MD  ciprofloxacin-dexamethasone [facility]) OTIC suspension Place 4 drops into the left ear 2 (two) times daily. Use for seven to ten days 03/14/19   Tilden Fossa, MD  dicyclomine (BENTYL) 20 MG tablet Take 1 tablet (20 mg total) by mouth 2 (two) times daily. 07/22/19   Liberty Handy, PA-C  methocarbamol (ROBAXIN) 500 MG tablet Take 1 tablet (500 mg total) by mouth 2 (two) times daily. 06/13/18  Berlinda Farve, David, NP  naproxen (NAPROSYN) Felicie Morn375 MG tablet Take 1 tablet (375 mg total) by mouth 2 (two) times daily. 06/13/18   Felicie MornSmith, David, NP  predniSONE (DELTASONE) 10 MG tablet Take 5 on day 2 (day one given in emergency department) Take 4 on day 3  Take 3 on day 4 Take 2 on day 5 Take 1 on day 6 09/25/18   Upstill, Melvenia BeamShari, PA-C    promethazine (PHENERGAN) 25 MG tablet Take 1 tablet (25 mg total) by mouth every 6 (six) hours as needed for nausea or vomiting. 07/22/19   Liberty HandyGibbons, Claudia J, PA-C    Physical Exam:  Constitutional: NAD, calm, comfortable Vitals:   07/26/19 1234 07/26/19 1235 07/26/19 1236 07/26/19 1337  BP: (!) 152/96     Pulse:  81 70   Resp:  13 18   Temp:      TempSrc:      SpO2:  100% 100% 99%   Eyes: PERRL, lids and conjunctivae normal ENMT: Mucous membranes are moist. Posterior pharynx clear of any exudate or lesions. Normal dentition.  Neck: Normal appearance, but stridor present Respiratory: Normal respiratory effort with mild wheezing appreciated, but O2 saturations maintained on room air. Cardiovascular: Regular rate and rhythm, no murmurs / rubs / gallops. No extremity edema. 2+ pedal pulses. No carotid bruits.  Abdomen: no tenderness, no masses palpated. No hepatosplenomegaly. Bowel sounds positive.  Musculoskeletal: no clubbing / cyanosis. No joint deformity upper and lower extremities. Good ROM, no contractures. Normal muscle tone.  Skin: no rashes, lesions, ulcers. No induration Neurologic: CN 2-12 grossly intact. Sensation intact, DTR normal. Strength 5/5 in all 4.  Psychiatric: Normal judgment and insight. Alert and oriented x 3. Normal mood.     Labs on Admission: I have personally reviewed following labs and imaging studies  CBC: Recent Labs  Lab 07/22/19 1752 07/26/19 0902  WBC 4.2 4.0  NEUTROABS  --  2.1  HGB 10.5* 10.0*  HCT 33.5* 31.4*  MCV 78.3* 76.8*  PLT 315 255   Basic Metabolic Panel: Recent Labs  Lab 07/22/19 1752 07/26/19 0902  NA 142 138  K 3.6 3.7  CL 108 106  CO2 27 24  GLUCOSE 86 101*  BUN 6 7  CREATININE 0.73 0.67  CALCIUM 9.0 8.7*   GFR: CrCl cannot be calculated (Unknown ideal weight.). Liver Function Tests: Recent Labs  Lab 07/22/19 1752  AST 15  ALT 14  ALKPHOS 43  BILITOT 0.3  PROT 6.5  ALBUMIN 3.6   Recent Labs  Lab  07/22/19 1752  LIPASE 42   No results for input(s): AMMONIA in the last 168 hours. Coagulation Profile: No results for input(s): INR, PROTIME in the last 168 hours. Cardiac Enzymes: No results for input(s): CKTOTAL, CKMB, CKMBINDEX, TROPONINI in the last 168 hours. BNP (last 3 results) No results for input(s): PROBNP in the last 8760 hours. HbA1C: No results for input(s): HGBA1C in the last 72 hours. CBG: No results for input(s): GLUCAP in the last 168 hours. Lipid Profile: No results for input(s): CHOL, HDL, LDLCALC, TRIG, CHOLHDL, LDLDIRECT in the last 72 hours. Thyroid Function Tests: No results for input(s): TSH, T4TOTAL, FREET4, T3FREE, THYROIDAB in the last 72 hours. Anemia Panel: No results for input(s): VITAMINB12, FOLATE, FERRITIN, TIBC, IRON, RETICCTPCT in the last 72 hours. Urine analysis:    Component Value Date/Time   COLORURINE YELLOW 07/22/2019 2038   APPEARANCEUR HAZY (A) 07/22/2019 2038   LABSPEC 1.011 07/22/2019 2038   PHURINE 6.0 07/22/2019 2038  GLUCOSEU NEGATIVE 07/22/2019 2038   HGBUR SMALL (A) 07/22/2019 2038   BILIRUBINUR NEGATIVE 07/22/2019 2038   [place] 07/22/2019 2038   PROTEINUR NEGATIVE 07/22/2019 2038   UROBILINOGEN 0.2 09/05/2013 1700   NITRITE NEGATIVE 07/22/2019 2038   LEUKOCYTESUR NEGATIVE 07/22/2019 2038   Sepsis Labs: Recent Results (from the past 240 hour(s))  Novel Coronavirus, NAA (Hosp order, Send-out to Ref Lab; TAT 18-24 hrs     Status: None   Collection Time: 07/22/19  9:47 PM   Specimen: Nasopharyngeal Swab; Respiratory  Result Value Ref Range Status   SARS-CoV-2, NAA NOT DETECTED NOT DETECTED Final    Comment: (NOTE) This nucleic acid amplification test was developed and its performance characteristics determined by [employer]. Nucleic acid amplification tests include PCR and TMA. This test has not been FDA cleared or approved. This test has been authorized by FDA under an Emergency Use Authorization  (EUA). This test is only authorized for the duration of time the declaration that circumstances exist justifying the authorization of the emergency use of in vitro diagnostic tests for detection of SARS-CoV-2 virus and/or diagnosis of COVID-19 infection under section 564(b)(1) of the Act, 21 U.S.C. 093ATF-5(D) (1), unless the authorization is terminated or revoked sooner. When diagnostic testing is negative, the possibility of a false negative result should be considered in the context of a patient's recent exposures and the presence of clinical signs and symptoms consistent with COVID-19. An individual without symptoms of COVID- 19 and who is not shedding SARS-CoV-2 vi rus would expect to have a negative (not detected) result in this assay. Performed At: [facility] [address] [place], [place] [id number] Rush Farmer MD CW:[id number]    [place]  Final    Comment: Performed at [facility], [place] [address], [place], [place] [address]  Group A Strep by PCR     Status: None   Collection Time: 07/26/19 10:27 AM   Specimen: Throat; Sterile Swab  Result Value Ref Range Status   Group A Strep by PCR NOT DETECTED NOT DETECTED Final    Comment: Performed at [facility], [place] [address] [place], [place] [address]     Radiological Exams on Admission: Dg Neck Soft Tissue  Result Date: 07/26/2019 CLINICAL DATA:  Stridor, wheezing, dyspnea EXAM: NECK SOFT TISSUES - 1+ VIEW COMPARISON:  None. FINDINGS: There is no evidence of retropharyngeal soft tissue swelling or epiglottic enlargement. The cervical airway is unremarkable and no radio-opaque foreign body identified. IMPRESSION: Negative. Electronically Signed   By: Ilona Sorrel M.D.   On: 07/26/2019 09:50   Dg Chest 2 View  Result Date: 07/26/2019 CLINICAL DATA:  Shortness of breath EXAM: CHEST - 2 VIEW COMPARISON:  11/08/2017 FINDINGS: Normal heart size and mediastinal contours. No acute  infiltrate or edema. No effusion or pneumothorax. No acute osseous findings. IMPRESSION: No active cardiopulmonary disease. Electronically Signed   By: Monte Fantasia M.D.   On: 07/26/2019 09:44   Ct Soft Tissue Neck W Contrast  Result Date: 07/26/2019 CLINICAL DATA:  Sore throat/stridor, epiglottitis or tonsillitis suspected. Additional history provided: Dry cough, sore throat that began yesterday. EXAM: CT NECK WITH CONTRAST TECHNIQUE: Multidetector CT imaging of the neck was performed using the standard protocol following the bolus administration of intravenous contrast. CONTRAST:  15mL OMNIPAQUE IOHEXOL 300 MG/ML  SOLN COMPARISON:  Radiograph of the neck soft tissues performed earlier the same day 07/26/2011 FINDINGS: Pharynx and larynx: Poor dentition with multiple carious teeth. There is  mild asymmetric prominence of the right palatine tonsil and of soft tissue within the right glossotonsillar sulcus. No evidence of peritonsillar abscess. No convincing evidence of epiglottitis on the current exam. No appreciable mass or swelling within the larynx. No airway compromise. Salivary glands: No evidence of inflammation, mass or stone. Thyroid: Negative Lymph nodes: No pathologically enlarged cervical chain lymph nodes. Vascular: The major vascular structures of the neck appear patent. Limited intracranial: No abnormality identified Visualized orbits: No evidence of acute abnormality. Mastoids and visualized paranasal sinuses: No significant paranasal sinus disease or mastoid effusion at the imaged levels. Skeleton: No acute abnormality or suspicious osseous lesion. Upper chest: No consolidation within the imaged lung apices. IMPRESSION: Mild asymmetric prominence of the right palatine tonsil and of soft tissue within the right glossotonsillar sulcus. Findings may reflect sequela of tonsillitis given provided history. However, close clinical follow-up recommended with repeat imaging if warranted to exclude  alternative etiologies (i.e. Malignancy). No evidence of peritonsillar abscess. No significant airway compromise. Electronically Signed   By: Jackey Loge DO   On: 07/26/2019 13:06    EKG: Independently reviewed.  Sinus rhythm at 77 bpm  Assessment/Plan Stridor secondary to suspected tonsillitis: Acute.  Patient presents with complaints of cough with worsening shortness of breath.  Group A strep by PCR was negative.  CT imaging showed mild asymmetric right pontine tonsil concerning sequela of tonsillitis without signs of abscess.  Patient had been initially started on empiric antibiotics of Augmentin.  Given previous history of nausea, vomiting,and diarrhea suspect sequela of viral syndrome.  Patient did require racemic epinephrine twice while in the ED due to stridor but was never noted to be hypoxic. -Admit to a MedSurg bed -Monitor O2 saturations -Check respiratory virus panel -Racemic epinephrine nebs as needed for stridor -Consider need of steroid taper   Myalgias, hypocalcemia: Patient reports having muscle aches of her bilateral lower extremities.  Calcium level is just mildly low.  This could be related with viral syndrome. -Give 1 g of calcium gluconate  Microcytic anemia: Patient with hemoglobin 10 g/dL with low MCV and MCH.  Likely related with iron deficiency anemia. -Would likely benefit from being on a multivitamin with iron   DVT prophylaxis: lovenox Code Status: Full Family Communication: No family present at bedside Disposition Plan: Likely discharge home in a.m. if medically stable Consults called: None Admission status: Observation  Clydie Braun MD [facility] Pager [phone]   If 7PM-7AM, please contact night-coverage [email or web address] Password [email or web address]  07/26/2019, 1:46 PM

## 2019-07-26 NOTE — ED Triage Notes (Signed)
Pt c/o dry cough that started yesterday. Denies known COVID exposure, tested on 11/1 with negative result.

## 2019-07-27 DIAGNOSIS — J039 Acute tonsillitis, unspecified: Secondary | ICD-10-CM | POA: Diagnosis not present

## 2019-07-27 LAB — CBC
HCT: 30.9 % — ABNORMAL LOW (ref 36.0–46.0)
Hemoglobin: 10.1 g/dL — ABNORMAL LOW (ref 12.0–15.0)
MCH: 24.5 pg — ABNORMAL LOW (ref 26.0–34.0)
MCHC: 32.7 g/dL (ref 30.0–36.0)
MCV: 74.8 fL — ABNORMAL LOW (ref 80.0–100.0)
Platelets: 258 10*3/uL (ref 150–400)
RBC: 4.13 MIL/uL (ref 3.87–5.11)
RDW: 17.1 % — ABNORMAL HIGH (ref 11.5–15.5)
WBC: 8.5 10*3/uL (ref 4.0–10.5)
nRBC: 0 % (ref 0.0–0.2)

## 2019-07-27 LAB — BASIC METABOLIC PANEL
Anion gap: 6 (ref 5–15)
BUN: <5 mg/dL — ABNORMAL LOW (ref 6–20)
CO2: 24 mmol/L (ref 22–32)
Calcium: 8.9 mg/dL (ref 8.9–10.3)
Chloride: 109 mmol/L (ref 98–111)
Creatinine, Ser: 0.62 mg/dL (ref 0.44–1.00)
GFR calc Af Amer: >60 mL/min (ref >60)
GFR calc non Af Amer: >60 mL/min (ref >60)
Glucose, Bld: 98 mg/dL (ref 70–99)
Potassium: 3.4 mmol/L — ABNORMAL LOW (ref 3.5–5.1)
Sodium: 139 mmol/L (ref 135–145)

## 2019-07-27 MED ORDER — ALBUTEROL SULFATE (2.5 MG/3ML) 0.083% IN NEBU: 3.0000 mL | INHALATION_SOLUTION | Freq: Four times a day (QID) | RESPIRATORY_TRACT | Status: DC | PRN

## 2019-07-27 MED ORDER — INFLUENZA VAC SPLIT QUAD 0.5 ML IM SUSY
0.5000 mL | PREFILLED_SYRINGE | Freq: Once | INTRAMUSCULAR | Status: AC
  Administered 2019-07-27: 0.5 mL via INTRAMUSCULAR
  Filled 2019-07-27: qty 0.5

## 2019-07-27 MED ORDER — MENTHOL 3 MG MT LOZG: 1.0000 | LOZENGE | OROMUCOSAL | 12 refills | Status: DC | PRN

## 2019-07-27 MED ORDER — TRAMADOL HCL 50 MG PO TABS: 50.0000 mg | ORAL_TABLET | Freq: Two times a day (BID) | ORAL | Status: DC | PRN

## 2019-07-27 MED ORDER — PREDNISONE 10 MG PO TABS: ORAL_TABLET | ORAL | 0 refills | Status: DC

## 2019-07-27 MED ORDER — FAMOTIDINE 20 MG PO TABS: 20.0000 mg | ORAL_TABLET | Freq: Every day | ORAL | Status: DC

## 2019-07-27 MED ORDER — INFLUENZA VAC SPLIT QUAD 0.5 ML IM SUSY: 0.5000 mL | PREFILLED_SYRINGE | INTRAMUSCULAR | Status: DC

## 2019-07-27 NOTE — Progress Notes (Signed)
Ebony Hansen to be D/C'd Home per MD order.  Discussed with the patient and all questions fully answered.   VSS, Skin clean, dry and intact without evidence of skin break down, no evidence of skin tears noted. IV catheter discontinued intact. Site without signs and symptoms of complications. Dressing and pressure applied.   An After Visit Summary was printed and given to the patient.    D/C education completed with patient/family including follow up instructions, medication list, d/c activities limitations if indicated, with other d/c instructions as indicated by MD - patient able to verbalize understanding, all questions fully answered.    Patient instructed to return to ED, call 911, or call MD for any changes in condition.    Patient escorted via [place], and D/C home via private car.

## 2019-07-27 NOTE — Discharge Summary (Signed)
Physician Discharge Summary  Ebony Hansen TKP:[id number] DOB: 03-08-1981 DOA: 07/26/2019  PCP: Ebony Limbo, PA-C  Admit date: 07/26/2019 Discharge date: 07/27/2019  Admitted From: home Discharge disposition: home   Recommendations for Outpatient Follow-Up:   1. Follow up with PCP next week if symptoms not resolved-- consideration to test for mono?-- she will need repeat imaging to ensure resolution/ rule out malignancy 2. Work note given   Discharge Diagnosis:   Principal Problem:   Tonsillitis Active Problems:   Stridor   Microcytic anemia   Myalgia    Discharge Condition: Improved.  Diet recommendation:   Regular.  Wound care: None.  Code status: Full.   History of Present Illness:   Ebony Hansen is a 38 y.o. female with medical history significant of hypertension, sickle cell trait, and anxiety presented with complaints of a dry cough and shortness of breath starting yesterday.  Symptoms initially started 8 days ago with reports of watery diarrhea with nausea and diarrhea.  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  Evaluated in the emergency department 4 days ago for the symptoms, but had negative work-up including CT scan of the abdomen and pelvis.  Patient was given fluids and ultimately discharged home.  Her Covid screening test was negative.  Since that time she reports that the nausea, vomiting, and diarrhea have subsided.  She started coughing yesterday and reported having difficulty sleeping due to feeling as though she was being smothered.  This morning she began to have some sore throat, but secondary to her cough.   Hospital Course by Problem:   Stridor secondary to suspected tonsillitis: Acute.   Patient presents with complaints of cough with worsening shortness of breath.  Group A strep by PCR was negative.  CT imaging showed mild asymmetric right pontine tonsil concerning sequela of  tonsillitis without signs of abscess.  Patient had been initially started on empiric antibiotics of Augmentin.  Given previous history of nausea, vomiting,and diarrhea suspect sequela of viral syndrome.  Patient did require racemic epinephrine twice while in the ED due to stridor but was never noted to be hypoxic. -respiratory virus panel negative -I sent home with a prescription of steroid taper (she was instructed to use only if needed)  Microcytic anemia: Patient with hemoglobin 10 g/dL with low MCV and MCH.  Likely related with iron deficiency anemia. -OTC multivitamin with iron       Medical Consultants:      Discharge Exam:   Vitals:   07/26/19 2318 07/27/19 0513  BP: (!) 107/58 121/78  Pulse: 71 78  Resp: 16 15  Temp: 97.9 F (36.6 C) 98.1 F (36.7 C)  SpO2: 100% 100%   Vitals:   07/26/19 1656 07/26/19 1702 07/26/19 2318 07/27/19 0513  BP: (!) 144/88  (!) 107/58 121/78  Pulse: 84  71 78  Resp: 20  16 15   Temp: 98.3 F (36.8 C)  97.9 F (36.6 C) 98.1 F (36.7 C)  TempSrc: Oral  Oral Oral  SpO2: 100%  100% 100%  Weight:  67.1 kg    Height:  5\' 3"  (1.6 m)      General exam: Appears calm and comfortable.   The results of significant diagnostics from this hospitalization (including imaging, microbiology, ancillary and laboratory) are listed below for reference.     Procedures and Diagnostic Studies:   Dg Neck Soft Tissue  Result Date: 07/26/2019 CLINICAL DATA:  Stridor,  wheezing, dyspnea EXAM: NECK SOFT TISSUES - 1+ VIEW COMPARISON:  None. FINDINGS: There is no evidence of retropharyngeal soft tissue swelling or epiglottic enlargement. The cervical airway is unremarkable and no radio-opaque foreign body identified. IMPRESSION: Negative. Electronically Signed   By: Delbert Phenix M.D.   On: 07/26/2019 09:50   Dg Chest 2 View  Result Date: 07/26/2019 CLINICAL DATA:  Shortness of breath EXAM: CHEST - 2 VIEW COMPARISON:  11/08/2017 FINDINGS: Normal heart size and  mediastinal contours. No acute infiltrate or edema. No effusion or pneumothorax. No acute osseous findings. IMPRESSION: No active cardiopulmonary disease. Electronically Signed   By: Marnee Spring M.D.   On: 07/26/2019 09:44   Ct Soft Tissue Neck W Contrast  Result Date: 07/26/2019 CLINICAL DATA:  Sore throat/stridor, epiglottitis or tonsillitis suspected. Additional history provided: Dry cough, sore throat that began yesterday. EXAM: CT NECK WITH CONTRAST TECHNIQUE: Multidetector CT imaging of the neck was performed using the standard protocol following the bolus administration of intravenous contrast. CONTRAST:  75mL OMNIPAQUE IOHEXOL 300 MG/ML  SOLN COMPARISON:  Radiograph of the neck soft tissues performed earlier the same day 07/26/2011 FINDINGS: Pharynx and larynx: Poor dentition with multiple carious teeth. There is mild asymmetric prominence of the right palatine tonsil and of soft tissue within the right glossotonsillar sulcus. No evidence of peritonsillar abscess. No convincing evidence of epiglottitis on the current exam. No appreciable mass or swelling within the larynx. No airway compromise. Salivary glands: No evidence of inflammation, mass or stone. Thyroid: Negative Lymph nodes: No pathologically enlarged cervical chain lymph nodes. Vascular: The major vascular structures of the neck appear patent. Limited intracranial: No abnormality identified Visualized orbits: No evidence of acute abnormality. Mastoids and visualized paranasal sinuses: No significant paranasal sinus disease or mastoid effusion at the imaged levels. Skeleton: No acute abnormality or suspicious osseous lesion. Upper chest: No consolidation within the imaged lung apices. IMPRESSION: Mild asymmetric prominence of the right palatine tonsil and of soft tissue within the right glossotonsillar sulcus. Findings may reflect sequela of tonsillitis given provided history. However, close clinical follow-up recommended with repeat  imaging if warranted to exclude alternative etiologies (i.e. Malignancy). No evidence of peritonsillar abscess. No significant airway compromise. Electronically Signed   By: Jackey Loge DO   On: 07/26/2019 13:06     Labs:   Basic Metabolic Panel: Recent Labs  Lab 07/22/19 1752 07/26/19 0902 07/27/19 0624  NA 142 138 139  K 3.6 3.7 3.4*  CL 108 106 109  CO2 GLUCOSE 86 101* 98  BUN 6 7 <5*  CREATININE 0.73 0.67 0.62  CALCIUM 9.0 8.7* 8.9   GFR Estimated Creatinine Clearance: 88.6 mL/min (by C-G formula based on SCr of 0.62 mg/dL). Liver Function Tests: Recent Labs  Lab 07/22/19 1752  AST 15  ALT 14  ALKPHOS 43  BILITOT 0.3  PROT 6.5  ALBUMIN 3.6   Recent Labs  Lab 07/22/19 1752  LIPASE 42   No results for input(s): AMMONIA in the last 168 hours. Coagulation profile No results for input(s): INR, PROTIME in the last 168 hours.  CBC: Recent Labs  Lab 07/22/19 1752 07/26/19 0902 07/27/19 0624  WBC 4.2 4.0 8.5  NEUTROABS  --  2.1  --   HGB 10.5* 10.0* 10.1*  HCT 33.5* 31.4* 30.9*  MCV 78.3* 76.8* 74.8*  PLT 315 255 258   Cardiac Enzymes: No results for input(s): CKTOTAL, CKMB, CKMBINDEX, TROPONINI in the last 168 hours. BNP: Invalid input(s): POCBNP CBG:  No results for input(s): GLUCAP in the last 168 hours. D-Dimer No results for input(s): DDIMER in the last 72 hours. Hgb A1c No results for input(s): HGBA1C in the last 72 hours. Lipid Profile No results for input(s): CHOL, HDL, LDLCALC, TRIG, CHOLHDL, LDLDIRECT in the last 72 hours. Thyroid function studies No results for input(s): TSH, T4TOTAL, T3FREE, THYROIDAB in the last 72 hours.  Invalid input(s): FREET3 Anemia work up No results for input(s): VITAMINB12, FOLATE, FERRITIN, TIBC, IRON, RETICCTPCT in the last 72 hours. Microbiology Recent Results (from the past 240 hour(s))  Novel Coronavirus, NAA (Hosp order, Send-out to Ref Lab; TAT 18-24 hrs     Status: None   Collection Time:  07/22/19  9:47 PM   Specimen: Nasopharyngeal Swab; Respiratory  Result Value Ref Range Status   SARS-CoV-2, NAA NOT DETECTED NOT DETECTED Final    Comment: (NOTE) This nucleic acid amplification test was developed and its performance characteristics determined by [employer]. Nucleic acid amplification tests include PCR and TMA. This test has not been FDA cleared or approved. This test has been authorized by FDA under an Emergency Use Authorization (EUA). This test is only authorized for the duration of time the declaration that circumstances exist justifying the authorization of the emergency use of in vitro diagnostic tests for detection of SARS-CoV-2 virus and/or diagnosis of COVID-19 infection under section 564(b)(1) of the Act, 21 U.S.C. 623JSE-8(B) (1), unless the authorization is terminated or revoked sooner. When diagnostic testing is negative, the possibility of a false negative result should be considered in the context of a patient's recent exposures and the presence of clinical signs and symptoms consistent with COVID-19. An individual without symptoms of COVID- 19 and who is not shedding SARS-CoV-2 vi rus would expect to have a negative (not detected) result in this assay. Performed At: [facility] [place] [address] [place], [place] [id number] Jolene Schimke MD PX:[phone]    Coronavirus Source NASOPHARYNGEAL  Final    Comment: Performed at [facility], [address], [place], [place] [address]  Group A Strep by PCR     Status: None   Collection Time: 07/26/19 10:27 AM   Specimen: Throat; Sterile Swab  Result Value Ref Range Status   Group A Strep by PCR NOT DETECTED NOT DETECTED Final    Comment: Performed at [facility], [address], [place], [place] [address]  SARS CORONAVIRUS 2 (TAT 6-24 HRS) Nasopharyngeal Nasopharyngeal Swab     Status: None   Collection Time: 07/26/19  2:11 PM   Specimen: Nasopharyngeal Swab  Result Value  Ref Range Status   SARS Coronavirus 2 NEGATIVE NEGATIVE Final    Comment: (NOTE) SARS-CoV-2 target nucleic acids are NOT DETECTED. The SARS-CoV-2 RNA is generally detectable in upper and lower respiratory specimens during the acute phase of infection. Negative results do not preclude SARS-CoV-2 infection, do not rule out co-infections with other pathogens, and should not be used as the sole basis for treatment or other patient management decisions. Negative results must be combined with clinical observations, patient history, and epidemiological information. The expected result is Negative. Fact Sheet for Patients: HairSlick.no Fact Sheet for Healthcare Providers: quierodirigir.com This test is not yet approved or cleared by the [place] FDA and  has been authorized for detection and/or diagnosis of SARS-CoV-2 by FDA under an Emergency Use Authorization (EUA). This EUA will remain  in effect (meaning this test can be used) for the duration of the COVID-19 declaration under Section 56  4(b)(1) of the Act, 21 U.S.C. section 360bbb-3(b)(1), unless the authorization is terminated or revoked sooner. Performed at [facility], [address] [place], [place] [address]   Respiratory Panel by PCR     Status: None   Collection Time: 07/26/19  5:32 PM   Specimen: Nasopharyngeal Swab; Respiratory  Result Value Ref Range Status   Adenovirus NOT DETECTED NOT DETECTED Final   Coronavirus 229E NOT DETECTED NOT DETECTED Final    Comment: (NOTE) The Coronavirus on the Respiratory Panel, DOES NOT test for the novel  Coronavirus (2019 nCoV)    Coronavirus HKU1 NOT DETECTED NOT DETECTED Final   Coronavirus NL63 NOT DETECTED NOT DETECTED Final   Coronavirus OC43 NOT DETECTED NOT DETECTED Final   Metapneumovirus NOT DETECTED NOT DETECTED Final   Rhinovirus / Enterovirus NOT DETECTED NOT DETECTED Final   Influenza A NOT DETECTED NOT  DETECTED Final   Influenza B NOT DETECTED NOT DETECTED Final   Parainfluenza Virus 1 NOT DETECTED NOT DETECTED Final   Parainfluenza Virus 2 NOT DETECTED NOT DETECTED Final   Parainfluenza Virus 3 NOT DETECTED NOT DETECTED Final   Parainfluenza Virus 4 NOT DETECTED NOT DETECTED Final   Respiratory Syncytial Virus NOT DETECTED NOT DETECTED Final   Bordetella pertussis NOT DETECTED NOT DETECTED Final   Chlamydophila pneumoniae NOT DETECTED NOT DETECTED Final   Mycoplasma pneumoniae NOT DETECTED NOT DETECTED Final    Comment: Performed at [facility], [address] [place], [place] [address]     Discharge Instructions:   Discharge Instructions    Discharge instructions   Complete by: As directed    REST I have given you a prescription for steroids-- I don't think you will need this but if you develop worsening stridor can use to help with swelling   Increase activity slowly   Complete by: As directed      Allergies as of 07/27/2019      Reactions   Shellfish Allergy Anaphylaxis, Swelling, Other (See Comments)   Caused drooling, also   Sulfa Drugs Cross Reactors Anaphylaxis, Swelling, Other (See Comments)   Caused drooling, also   Sulfasalazine Anaphylaxis, Swelling, Other (See Comments)   Caused drooling, also      Medication List    STOP taking these medications   amoxicillin-clavulanate 875-125 MG tablet Commonly known as: AUGMENTIN   ciprofloxacin-dexamethasone OTIC suspension Commonly known as: CIPRODEX   dicyclomine 20 MG tablet Commonly known as: BENTYL   lisinopril 5 MG tablet Commonly known as: ZESTRIL   methocarbamol 500 MG tablet Commonly known as: ROBAXIN   naproxen 375 MG tablet Commonly known as: NAPROSYN   promethazine 25 MG tablet Commonly known as: PHENERGAN   Vitamin D3 1.25 MG (50000 UT) Caps     TAKE these medications   Adderall XR 25 MG 24 hr capsule Generic drug: amphetamine-dextroamphetamine Take 25 mg by mouth every  morning.   Airborne Gummies Chew Chew 1-4 tablets by mouth 3 (three) times daily as needed (for immune support).   albuterol 108 (90 Base) MCG/ACT inhaler Commonly known as: VENTOLIN HFA Inhale 2 puffs into the lungs every 6 (six) hours as needed for wheezing or shortness of breath.   famotidine 20 MG tablet Commonly known as: PEPCID Take 1 tablet (20 mg total) by mouth daily.   ibuprofen 200 MG tablet Commonly known as: ADVIL Take 800 mg by mouth every 6 (six) hours as needed for headache.   menthol-cetylpyridinium 3 MG lozenge Commonly known as: CEPACOL  Take 1 lozenge (3 mg total) by mouth as needed for sore throat.   predniSONE 10 MG tablet Commonly known as: DELTASONE Take 5 on day 1  Take 4 on day 2  Take 3 on day 3 Take 2 on day 4 Take 1 on day 5 What changed: additional instructions   traMADol 50 MG tablet Commonly known as: ULTRAM Take 50 mg by mouth 2 (two) times daily as needed (for pain).      Follow-up Information    O'Buch, Greta, PA-C Follow up in 1 week(s).   Specialty: Internal Medicine Contact information: [address] [place] [phone]            Time coordinating discharge: 25 min  Signed:  Joseph ArtJessica U Makaiya Geerdes DO  [facility] 07/27/2019, 8:21 AM

## 2019-09-17 ENCOUNTER — Other Ambulatory Visit: Payer: Self-pay | Admitting: Internal Medicine

## 2019-09-17 DIAGNOSIS — J358 Other chronic diseases of tonsils and adenoids: Secondary | ICD-10-CM

## 2019-09-26 ENCOUNTER — Ambulatory Visit
Admission: RE | Admit: 2019-09-26 | Discharge: 2019-09-26 | Disposition: A | Discharge status: Home / Self Care | Payer: No Typology Code available for payment source | Source: Ambulatory Visit | Attending: Internal Medicine | Admitting: Internal Medicine

## 2019-09-26 DIAGNOSIS — J358 Other chronic diseases of tonsils and adenoids: Secondary | ICD-10-CM

## 2019-09-26 MED ORDER — IOPAMIDOL (ISOVUE-300) INJECTION 61%
75.0000 mL | Freq: Once | INTRAVENOUS | Status: AC | PRN
  Administered 2019-09-26: 75 mL via INTRAVENOUS

## 2020-08-28 ENCOUNTER — Emergency Department (HOSPITAL_COMMUNITY)
Admission: EM | Admit: 2020-08-28 | Discharge: 2020-08-29 | Disposition: A | Discharge status: Home / Self Care | Payer: 59 | Attending: Emergency Medicine | Admitting: Emergency Medicine

## 2020-08-28 ENCOUNTER — Encounter (HOSPITAL_COMMUNITY): Payer: Self-pay | Admitting: Emergency Medicine

## 2020-08-28 ENCOUNTER — Other Ambulatory Visit: Payer: Self-pay

## 2020-08-28 DIAGNOSIS — R070 Pain in throat: Secondary | ICD-10-CM | POA: Insufficient documentation

## 2020-08-28 DIAGNOSIS — M791 Myalgia, unspecified site: Secondary | ICD-10-CM | POA: Insufficient documentation

## 2020-08-28 DIAGNOSIS — Z5321 Procedure and treatment not carried out due to patient leaving prior to being seen by health care provider: Secondary | ICD-10-CM | POA: Diagnosis not present

## 2020-08-28 NOTE — ED Triage Notes (Signed)
Patient reports generalized body aches / sore throat onset last week , negative Covid test this Monday , respirations unlabored , no fever or chills .

## 2020-08-29 LAB — CBC WITH DIFFERENTIAL/PLATELET
Abs Immature Granulocytes: 0.02 10*3/uL (ref 0.00–0.07)
Basophils Absolute: 0.1 10*3/uL (ref 0.0–0.1)
Basophils Relative: 1 %
Eosinophils Absolute: 0.2 10*3/uL (ref 0.0–0.5)
Eosinophils Relative: 3 %
HCT: 28.9 % — ABNORMAL LOW (ref 36.0–46.0)
Hemoglobin: 8.6 g/dL — ABNORMAL LOW (ref 12.0–15.0)
Immature Granulocytes: 0 %
Lymphocytes Relative: 24 %
Lymphs Abs: 1.6 10*3/uL (ref 0.7–4.0)
MCH: 22.6 pg — ABNORMAL LOW (ref 26.0–34.0)
MCHC: 29.8 g/dL — ABNORMAL LOW (ref 30.0–36.0)
MCV: 75.9 fL — ABNORMAL LOW (ref 80.0–100.0)
Monocytes Absolute: 0.7 10*3/uL (ref 0.1–1.0)
Monocytes Relative: 10 %
Neutro Abs: 4.2 10*3/uL (ref 1.7–7.7)
Neutrophils Relative %: 62 %
Platelets: 325 10*3/uL (ref 150–400)
RBC: 3.81 MIL/uL — ABNORMAL LOW (ref 3.87–5.11)
RDW: 18.2 % — ABNORMAL HIGH (ref 11.5–15.5)
WBC: 6.8 10*3/uL (ref 4.0–10.5)
nRBC: 0 % (ref 0.0–0.2)

## 2020-08-29 LAB — BASIC METABOLIC PANEL
Anion gap: 11 (ref 5–15)
BUN: 7 mg/dL (ref 6–20)
CO2: 23 mmol/L (ref 22–32)
Calcium: 8.8 mg/dL — ABNORMAL LOW (ref 8.9–10.3)
Chloride: 106 mmol/L (ref 98–111)
Creatinine, Ser: 0.76 mg/dL (ref 0.44–1.00)
GFR, Estimated: >60 mL/min (ref >60)
Glucose, Bld: 82 mg/dL (ref 70–99)
Potassium: 3.4 mmol/L — ABNORMAL LOW (ref 3.5–5.1)
Sodium: 140 mmol/L (ref 135–145)

## 2020-08-29 LAB — I-STAT BETA HCG BLOOD, ED (MC, WL, AP ONLY): I-stat hCG, quantitative: <5 m[IU]/mL (ref <5)

## 2020-08-29 LAB — GROUP A STREP BY PCR: Group A Strep by PCR: DETECTED — AB

## 2020-08-29 NOTE — ED Notes (Signed)
Pt didn't answer when called for vitals  °

## 2020-11-12 ENCOUNTER — Emergency Department (HOSPITAL_BASED_OUTPATIENT_CLINIC_OR_DEPARTMENT_OTHER)
Admission: EM | Admit: 2020-11-12 | Discharge: 2020-11-12 | Disposition: A | Discharge status: Home / Self Care | Payer: 59 | Attending: Emergency Medicine | Admitting: Emergency Medicine

## 2020-11-12 ENCOUNTER — Encounter (HOSPITAL_BASED_OUTPATIENT_CLINIC_OR_DEPARTMENT_OTHER): Payer: Self-pay

## 2020-11-12 ENCOUNTER — Emergency Department (HOSPITAL_BASED_OUTPATIENT_CLINIC_OR_DEPARTMENT_OTHER): Payer: 59

## 2020-11-12 ENCOUNTER — Other Ambulatory Visit: Payer: Self-pay

## 2020-11-12 DIAGNOSIS — J029 Acute pharyngitis, unspecified: Secondary | ICD-10-CM | POA: Diagnosis not present

## 2020-11-12 DIAGNOSIS — I10 Essential (primary) hypertension: Secondary | ICD-10-CM | POA: Insufficient documentation

## 2020-11-12 DIAGNOSIS — R0602 Shortness of breath: Secondary | ICD-10-CM | POA: Diagnosis not present

## 2020-11-12 DIAGNOSIS — M791 Myalgia, unspecified site: Secondary | ICD-10-CM | POA: Insufficient documentation

## 2020-11-12 DIAGNOSIS — Z20822 Contact with and (suspected) exposure to covid-19: Secondary | ICD-10-CM | POA: Insufficient documentation

## 2020-11-12 LAB — CBC WITH DIFFERENTIAL/PLATELET
Abs Immature Granulocytes: 0.01 10*3/uL (ref 0.00–0.07)
Basophils Absolute: 0 10*3/uL (ref 0.0–0.1)
Basophils Relative: 1 %
Eosinophils Absolute: 0.1 10*3/uL (ref 0.0–0.5)
Eosinophils Relative: 3 %
HCT: 33.5 % — ABNORMAL LOW (ref 36.0–46.0)
Hemoglobin: 10.2 g/dL — ABNORMAL LOW (ref 12.0–15.0)
Immature Granulocytes: 0 %
Lymphocytes Relative: 31 %
Lymphs Abs: 1.1 10*3/uL (ref 0.7–4.0)
MCH: 21.7 pg — ABNORMAL LOW (ref 26.0–34.0)
MCHC: 30.4 g/dL (ref 30.0–36.0)
MCV: 71.3 fL — ABNORMAL LOW (ref 80.0–100.0)
Monocytes Absolute: 0.3 10*3/uL (ref 0.1–1.0)
Monocytes Relative: 10 %
Neutro Abs: 1.9 10*3/uL (ref 1.7–7.7)
Neutrophils Relative %: 55 %
Platelets: 391 10*3/uL (ref 150–400)
RBC: 4.7 MIL/uL (ref 3.87–5.11)
RDW: 17.8 % — ABNORMAL HIGH (ref 11.5–15.5)
WBC: 3.5 10*3/uL — ABNORMAL LOW (ref 4.0–10.5)
nRBC: 0 % (ref 0.0–0.2)

## 2020-11-12 LAB — COMPREHENSIVE METABOLIC PANEL
ALT: 13 U/L (ref 0–44)
AST: 19 U/L (ref 15–41)
Albumin: 4 g/dL (ref 3.5–5.0)
Alkaline Phosphatase: 48 U/L (ref 38–126)
Anion gap: 9 (ref 5–15)
BUN: 8 mg/dL (ref 6–20)
CO2: 25 mmol/L (ref 22–32)
Calcium: 8.9 mg/dL (ref 8.9–10.3)
Chloride: 105 mmol/L (ref 98–111)
Creatinine, Ser: 0.7 mg/dL (ref 0.44–1.00)
GFR, Estimated: >60 mL/min (ref >60)
Glucose, Bld: 90 mg/dL (ref 70–99)
Potassium: 3.3 mmol/L — ABNORMAL LOW (ref 3.5–5.1)
Sodium: 139 mmol/L (ref 135–145)
Total Bilirubin: 0.5 mg/dL (ref 0.3–1.2)
Total Protein: 7.7 g/dL (ref 6.5–8.1)

## 2020-11-12 LAB — SARS CORONAVIRUS 2 (TAT 6-24 HRS): SARS Coronavirus 2: NEGATIVE

## 2020-11-12 LAB — PREGNANCY, URINE: Preg Test, Ur: NEGATIVE

## 2020-11-12 MED ORDER — SODIUM CHLORIDE 0.9 % IV SOLN
1000.0000 mL | INTRAVENOUS | Status: DC
  Administered 2020-11-12: 1000 mL via INTRAVENOUS

## 2020-11-12 MED ORDER — DEXAMETHASONE SODIUM PHOSPHATE 10 MG/ML IJ SOLN
6.0000 mg | Freq: Once | INTRAMUSCULAR | Status: AC
  Filled 2020-11-12: qty 1

## 2020-11-12 MED ORDER — DEXAMETHASONE SODIUM PHOSPHATE 10 MG/ML IJ SOLN
INTRAMUSCULAR | Status: AC
  Administered 2020-11-12: 6 mg via INTRAVENOUS
  Filled 2020-11-12: qty 1

## 2020-11-12 MED ORDER — POTASSIUM CHLORIDE CRYS ER 20 MEQ PO TBCR
40.0000 meq | EXTENDED_RELEASE_TABLET | Freq: Once | ORAL | Status: AC
  Administered 2020-11-12: 40 meq via ORAL
  Filled 2020-11-12: qty 2

## 2020-11-12 MED ORDER — ALBUTEROL SULFATE HFA 108 (90 BASE) MCG/ACT IN AERS
2.0000 | INHALATION_SPRAY | Freq: Once | RESPIRATORY_TRACT | Status: AC
  Administered 2020-11-12: 2 via RESPIRATORY_TRACT
  Filled 2020-11-12: qty 6.7

## 2020-11-12 MED ORDER — SODIUM CHLORIDE 0.9 % IV BOLUS
500.0000 mL | Freq: Once | INTRAVENOUS | Status: AC
  Administered 2020-11-12: 500 mL via INTRAVENOUS

## 2020-11-12 MED ORDER — ACETAMINOPHEN 500 MG PO TABS
1000.0000 mg | ORAL_TABLET | Freq: Once | ORAL | Status: AC
  Administered 2020-11-12: 1000 mg via ORAL
  Filled 2020-11-12: qty 2

## 2020-11-12 MED ORDER — AEROCHAMBER PLUS FLO-VU MEDIUM MISC
1.0000 | Freq: Once | Status: AC
  Administered 2020-11-12: 1
  Filled 2020-11-12: qty 1

## 2020-11-12 NOTE — ED Notes (Signed)
Asked pt if she could urinate for sample, pt states does not need to at this time.

## 2020-11-12 NOTE — Discharge Instructions (Addendum)
As we discussed today given that you had a Covid exposure and are now having Covid-like symptoms even if your test comes back negative today I would suspect a false negative and you should quarantine as if you have COVID.  Today you were given IV steroids and albuterol to help with your wheezing.  The steroid should last in your system for the next few days.  Today your potassium was slightly low.  Please try to eat a banana every day for the next few days or other high potassium foods.  You were given potassium to replace this while in the emergency room.  I have given you instructions on ibuprofen and Tylenol as listed below.  There is some data that suggests Tylenol may be safer than ibuprofen and other similar medicines in the setting of Covid, but this reason I would recommend taking Tylenol first and if that is not controlling her symptoms you can consider adding in ibuprofen.  Please take Ibuprofen (Advil, motrin) and Tylenol (acetaminophen) to relieve your pain.    You may take up to 600 MG (3 pills) of normal strength ibuprofen every 8 hours as needed.   You make take tylenol, up to 1,000 mg (two extra strength pills) every 8 hours as needed.   It is safe to take ibuprofen and tylenol at the same time as they work differently.   Do not take more than 3,000 mg tylenol in a 24 hour period (not more than one dose every 8 hours.  Please check all medication labels as many medications such as pain and cold medications may contain tylenol.  Do not drink alcohol while taking these medications.  Do not take other NSAID'S while taking ibuprofen (such as aleve or naproxen).  Please take ibuprofen with food to decrease stomach upset.  As we discussed you need to stay home and isolated for 5 days from when your symptoms started.  Day 1 is the first full day you had of symptoms. After 5 days if you are fever free for 24 hours without fever reducing medicine and your symptoms are improving then you may  stop isolation.  From 5 to 10 days since your symptom onset you need to wear well fitted mask for 10 days, and follow appropriate social distancing.  Please avoid being around people who are high risk and consider working from home during this time if possible.  You have a Covid test pending.  As we discussed even if this is negative I would suspect that you do have Covid and you should quarantine appropriately.

## 2020-11-12 NOTE — ED Triage Notes (Addendum)
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 Vaccinated x2. Started with congestion on Sunday, now having sore throat, body aches, nausea, cough, runny nose, headache. Took home test which was negative. Afebrile, denies SOB

## 2020-11-12 NOTE — ED Provider Notes (Signed)
[facility] EMERGENCY DEPARTMENT Provider Note   CSN: [id number] Arrival date & time: 11/12/20  1033     History Chief Complaint  Patient presents with  . Sore Throat    Covid Exposure    Ebony Hansen is a 40 y.o. female with a past medical history of ADHD, anxiety, hypertension, sickle cell trait, asthma, who presents today for evaluation of shortness of breath.  She has had 2 Covid vaccines however did not get a booster.  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 She states that she started with congestion on Sunday and is now having sore throat, body aches, decreased appetite, cough, headache, myalgias/arthralgias and chills.  She denies known fevers.  She denies nausea vomiting or diarrhea.  No abdominal pain.  She did take a home antigen test and that was negative.  She states that when she walks she starts feeling very lightheaded like she is going to pass out and become severely short of breath.  She denies any leg swelling.  No hormone use or birth control.  HPI     Past Medical History:  Diagnosis Date  . ADHD (attention deficit hyperactivity disorder)   . Anxiety   . Chlamydia   . Hypertension   . Sickle cell trait [facility])     Patient Active Problem List   Diagnosis Date Noted  . Stridor 07/26/2019  . Microcytic anemia 07/26/2019  . Tonsillitis 07/26/2019  . Myalgia 07/26/2019    Past Surgical History:  Procedure Laterality Date  . CESAREAN SECTION    . WISDOM TOOTH EXTRACTION       OB History    Gravida  6   Para  2   Term  2   Preterm      AB  3   Living  2     SAB  2   IAB  1   Ectopic      Multiple      Live Births              History reviewed. No pertinent family history.  Social History   Tobacco Use  . Smoking status: Never Smoker  . Smokeless tobacco: Never Used  Substance Use Topics  . Alcohol use: No  . Drug use: No    Home Medications Prior to Admission  medications   Medication Sig Start Date End Date Taking? Authorizing Provider  ADDERALL XR 25 MG 24 hr capsule Take 25 mg by mouth every morning.  07/05/19   [provider]  albuterol (VENTOLIN HFA) 108 (90 Base) MCG/ACT inhaler Inhale 2 puffs into the lungs every 6 (six) hours as needed for wheezing or shortness of breath.     [provider]  famotidine (PEPCID) 20 MG tablet Take 1 tablet (20 mg total) by mouth daily. 07/27/19   Joseph Art, DO  ibuprofen (ADVIL) 200 MG tablet Take 800 mg by mouth every 6 (six) hours as needed for headache.    [provider]  menthol-cetylpyridinium (CEPACOL) 3 MG lozenge Take 1 lozenge (3 mg total) by mouth as needed for sore throat. 07/27/19   Joseph Art, DO  Multiple Vitamins-Minerals (AIRBORNE GUMMIES) CHEW Chew 1-4 tablets by mouth 3 (three) times daily as needed (for immune support).    [provider]  predniSONE (DELTASONE) 10 MG tablet Take 5 on day 1  Take 4 on day 2  Take 3 on day 3  Take 2 on day 4 Take 1 on day 5 07/27/19   Joseph Art, DO  traMADol (ULTRAM) 50 MG tablet Take 50 mg by mouth 2 (two) times daily as needed (for pain).  07/22/19   [provider]    Allergies    Shellfish allergy, Sulfa drugs cross reactors, and Sulfasalazine  Review of Systems   Review of Systems  Constitutional: Positive for appetite change, chills and fatigue. Negative for fever.  HENT: Positive for congestion and sore throat.   Eyes: Negative for visual disturbance.  Respiratory: Positive for cough, chest tightness and shortness of breath.   Cardiovascular: Negative for palpitations and leg swelling.  Gastrointestinal: Negative for abdominal pain, diarrhea, nausea and vomiting.  Genitourinary: Negative for dysuria.  Musculoskeletal: Positive for arthralgias and myalgias.  Skin: Negative for color change and rash.  Neurological: Positive for light-headedness and headaches. Negative for weakness.   Psychiatric/Behavioral: Negative for confusion.  All other systems reviewed and are negative.   Physical Exam Updated Vital Signs BP (!) 119/57   Pulse 60   Temp 98.7 F (37.1 C) (Oral)   Resp 13   Ht 5\' 3"  (1.6 m)   Wt 70.3 kg   LMP 11/10/2020   SpO2 100%   BMI 27.46 kg/m   Physical Exam Vitals and nursing note reviewed.  Constitutional:      General: She is not in acute distress.    Appearance: She is not ill-appearing.  HENT:     Head: Atraumatic.  Eyes:     Conjunctiva/sclera: Conjunctivae normal.  Cardiovascular:     Rate and Rhythm: Normal rate.  Pulmonary:     Effort: Pulmonary effort is normal. No respiratory distress.     Breath sounds: Wheezing (Mild, diffuse bilaterally) present.  Abdominal:     General: There is no distension.  Musculoskeletal:     Cervical back: Normal range of motion and neck supple.     Comments: No obvious acute injury  Skin:    General: Skin is warm.  Neurological:     Mental Status: She is alert.     Comments: Awake and alert, answers all questions appropriately.  Speech is not slurred.  Psychiatric:        Mood and Affect: Mood normal.        Behavior: Behavior normal.     ED Results / Procedures / Treatments   Labs (all labs ordered are listed, but only abnormal results are displayed) Labs Reviewed  CBC WITH DIFFERENTIAL/PLATELET - Abnormal; Notable for the following components:      Result Value   WBC 3.5 (*)    Hemoglobin 10.2 (*)    HCT 33.5 (*)    MCV 71.3 (*)    MCH 21.7 (*)    RDW 17.8 (*)    All other components within normal limits  COMPREHENSIVE METABOLIC PANEL - Abnormal; Notable for the following components:   Potassium 3.3 (*)    All other components within normal limits  SARS CORONAVIRUS 2 (TAT 6-24 HRS)  PREGNANCY, URINE    EKG EKG Interpretation  Date/Time:  Wednesday November 12 2020 11:30:11 EST Ventricular Rate:  63 PR Interval:    QRS Duration: 81 QT Interval:  415 QTC  Calculation: 425 R Axis:   72 Text Interpretation: Sinus rhythm Normal ECG Confirmed by [id number] (Gwyneth Sprout) on 11/12/2020 11:33:54 AM   Radiology DG Chest Port 1 View  Result Date: 11/12/2020 CLINICAL DATA:  Congestion since Sunday now sore throat body aches  nausea cough and runny nose. EXAM: PORTABLE CHEST 1 VIEW COMPARISON:  Chest radiograph July 26, 2019 FINDINGS: The heart size and mediastinal contours are within normal limits. No focal consolidation. No pleural effusion. No pneumothorax. The visualized skeletal structures are unremarkable. IMPRESSION: No active disease. Electronically Signed   By: Maudry Mayhew MD   On: 11/12/2020 12:04    Procedures Procedures   Medications Ordered in ED Medications  0.9 %  sodium chloride infusion (0 mLs Intravenous Stopped 11/12/20 1356)  albuterol (VENTOLIN HFA) 108 (90 Base) MCG/ACT inhaler 2 puff (2 puffs Inhalation Given 11/12/20 1117)  AeroChamber Plus Flo-Vu Medium MISC 1 each (1 each Other Given 11/12/20 1120)  dexamethasone (DECADRON) injection 6 mg (6 mg Intravenous Given 11/12/20 1155)  acetaminophen (TYLENOL) tablet 1,000 mg (1,000 mg Oral Given 11/12/20 1154)  sodium chloride 0.9 % bolus 500 mL (0 mLs Intravenous Stopped 11/12/20 1247)  potassium chloride SA (KLOR-CON) CR tablet 40 mEq (40 mEq Oral Given 11/12/20 1229)    ED Course  I have reviewed the triage vital signs and the nursing notes.  Pertinent labs & imaging results that were available during my care of the patient were reviewed by me and considered in my medical decision making (see chart for details).  Clinical Course as of 11/12/20 1706  Wed Nov 12, 2020  1111 I personally ambulated patient at bedside.  While laying down in bed she is 100% on room air however with walking in place at bedside she rapidly desaturated dipping down to 92 rapidly followed by 86 then 78% on room air.  This was all with a good waveform and patient was appropriately symptomatic.  RN  informed, orders placed. Patient rapidly returned to 100% on room air with cessation of activity. [EH]  1125 RN had patient ambulate on pulse ox and maintain sats in the 90s. This is with a different machine. We went to try to repeat with both machines, patient had just gotten her inhaler and was coughing significantly. She is unable to ambulate at this time. Will hold off on admission labs, obtain chest x-ray, chest, EKG and reevaluate. [EH]    Clinical Course User Index [EH] Cristina Gong, PA-C   MDM Rules/Calculators/A&P                         Daisy Blossom was evaluated in Emergency Department on 11/12/2020 for the symptoms described in the history of present illness. She was evaluated in the context of the global COVID-19 pandemic, which necessitated consideration that the patient might be at risk for infection with the SARS-CoV-2 virus that causes COVID-19. Institutional protocols and algorithms that pertain to the evaluation of patients at risk for COVID-19 are in a state of rapid change based on information released by regulatory bodies including the CDC and federal and state organizations. These policies and algorithms were followed during the patient's care in the ED.  Patient is a relatively healthy 40 year old woman who presents today for evaluation of shortness of breath, myalgias/arthralgias, fatigue, poor appetite, sore throat and cough after a Covid exposure.  She has had 2 Covid vaccines however has not had a booster.  Her symptoms started 3 days ago.  While laying in bed she was 98 to 100% on room air, when I initially had her ambulate she became very lightheaded, dropped her oxygen to the 70s and had to sit back down. After she was treated with albuterol, IV steroids, IV fluids, and  Tylenol she was able to ambulate without desaturation or significant symptoms and no longer felt presyncopal.  Doubt PE.  Covid test is sent.  Her chest x-ray is without pneumothorax or  other cause for symptoms.  EKG is unremarkable, she is mildly anemic however this appears improved from baseline.  She is also mildly hypokalemic which is orally repleted.  I discussed with patient that given she had a known Covid exposure and now has Covid-like symptoms that even if her test today comes back negative she needs to assume she has Covid and quarantine appropriately.  She is given a work note stating this.  Recommended obtaining a pulse oximeter for use at home to continue to monitor.  Return precautions were discussed with patient who states their understanding.  At the time of discharge patient denied any unaddressed complaints or concerns.  Patient is agreeable for discharge home.  Note: Portions of this report may have been transcribed using voice recognition software. Every effort was made to ensure accuracy; however, inadvertent computerized transcription errors may be present  Final Clinical Impression(s) / ED Diagnoses Final diagnoses:  Suspected COVID-19 virus infection    Rx / DC Orders ED Discharge Orders    None       Cristina GongHammond, Hartlee Amedee W, PA-C 11/12/20 1708    Gwyneth SproutPlunkett, Whitney, MD 11/16/20 1311

## 2020-11-12 NOTE — ED Notes (Signed)
Ambulated patient in room with continuous portable pulse oximetry. Pt SpO2 maintained between 99-100%. Patient having episodes of coughing and feeling dizzy, needing to sit down. PA at bedside to evaluate as she states O2 dropped to 70% prior upon standing. Will re-evaluate after starting IV and obtaining chest Xray

## 2020-11-12 NOTE — ED Notes (Addendum)
Ambulated pt to restroom with SpO2 monitor. Pt maintained O2 between 95-100%. States she felt better and did not become dizzy

## 2020-11-12 NOTE — ED Notes (Signed)
XR at bedside. Will obtain EKG when PT is free to do so.

## 2021-07-30 IMAGING — CT CT NECK W/ CM
4 of 5 series · 15 of 33 positions shown, 18 images · IV contrast (APPLIED)
Comparison: Radiograph of the neck soft tissues performed earlier
the same day 07/26/2011

CLINICAL DATA: Sore throat/stridor, epiglottitis or tonsillitis
suspected. Additional history provided: Dry cough, sore throat that
began yesterday.

EXAM:
CT NECK WITH CONTRAST
TECHNIQUE: Multidetector CT imaging of the neck was performed using the
standard protocol following the bolus administration of intravenous
contrast.
CONTRAST:  75mL OMNIPAQUE IOHEXOL 300 MG/ML  SOLN

[Series 3: neck 2.0 i31s 3 · axial · 0.47mm/px · z∈[-160,-102]mm · 2 of 118 slices shown]
[im 30/118  bone]
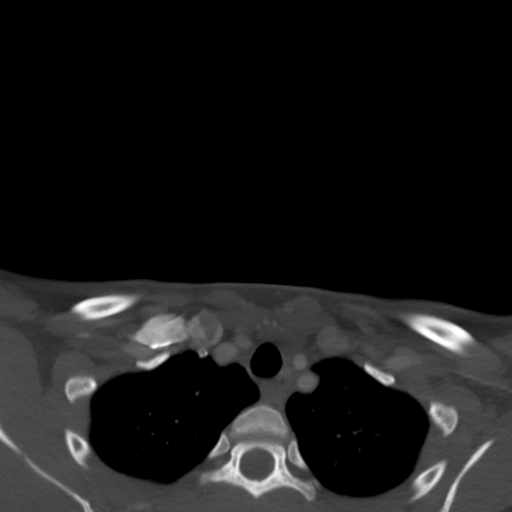
[im 59/118  bone]
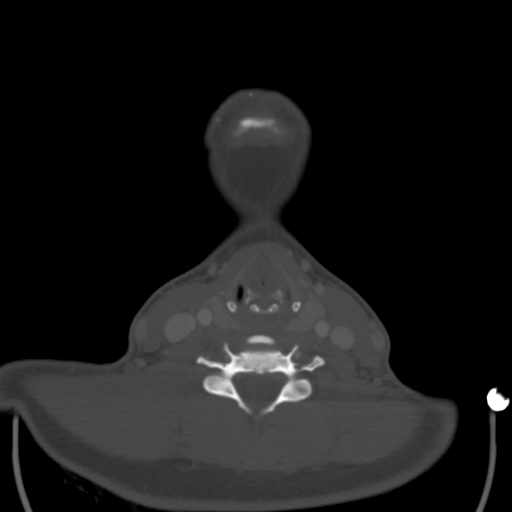

[Series 7: coronal st · coronal · 0.46mm/px · 3 of 139 slices shown]
[im 28/139  bone]
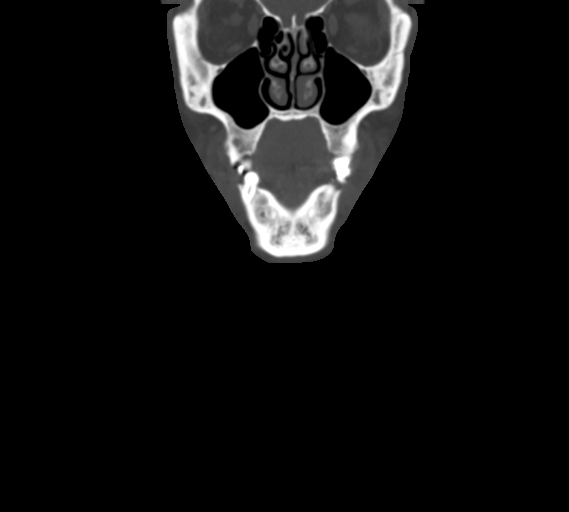
[im 56/139  bone]
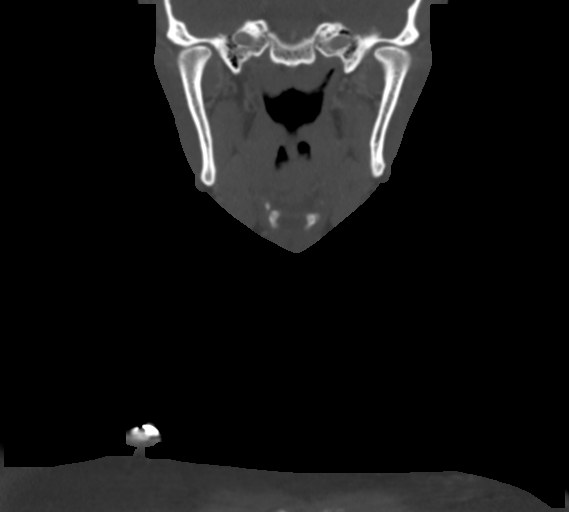
[im 83/139  bone]
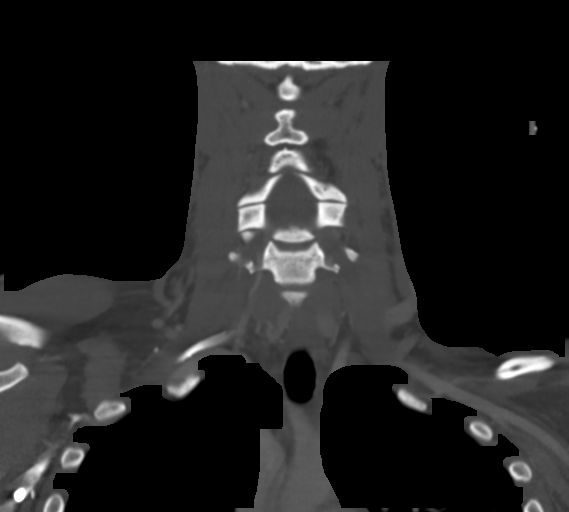

[Series 8: sagittal st · sagittal · 0.46mm/px · 5 of 101 slices shown, 6 images]
[im 34/101  bone]
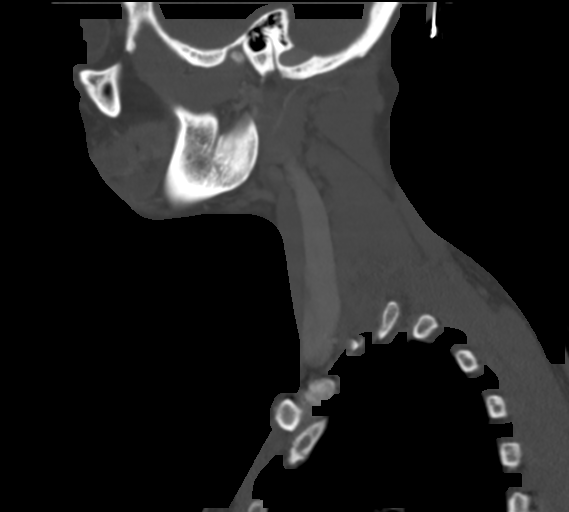
[im 42/101  bone]
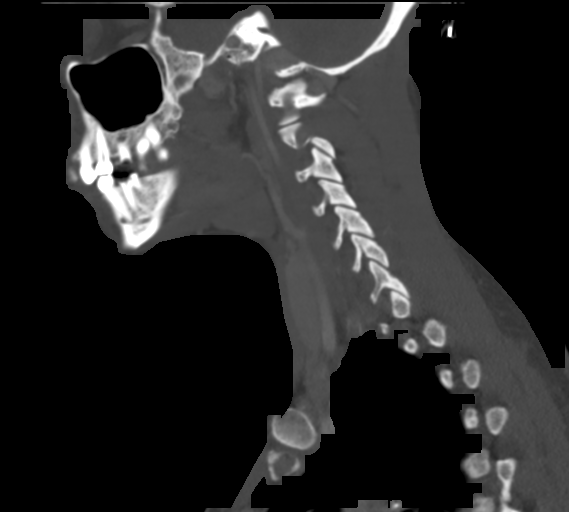
[im 51/101  soft-tissue]
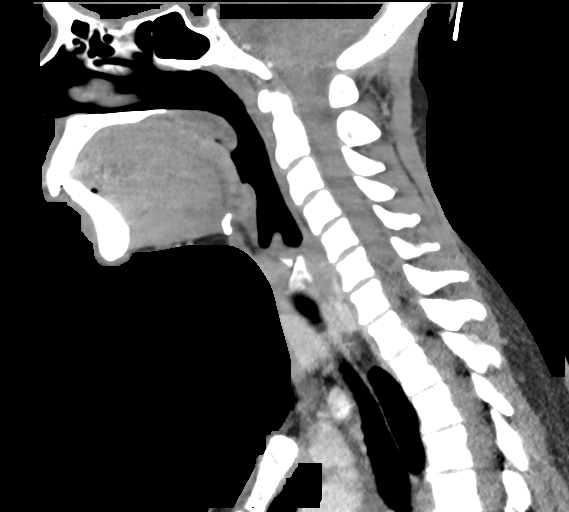
[im 51/101  bone]
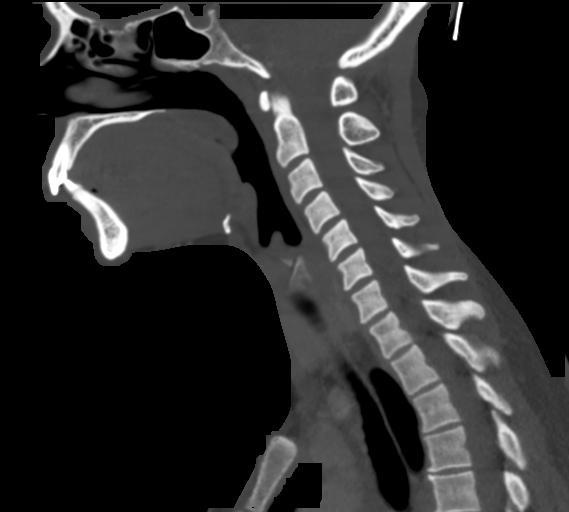
[im 59/101  bone]
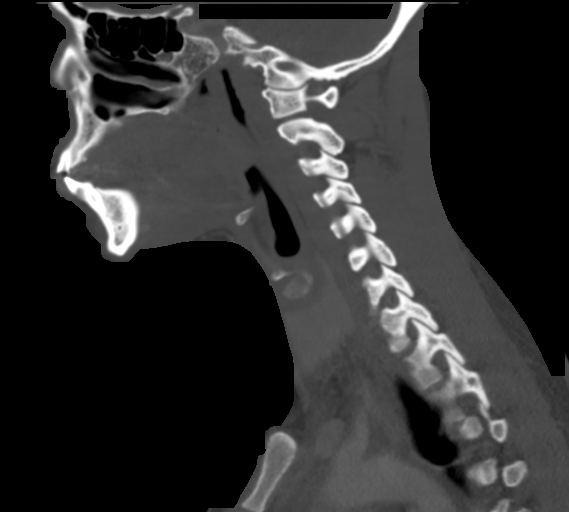
[im 67/101  bone]
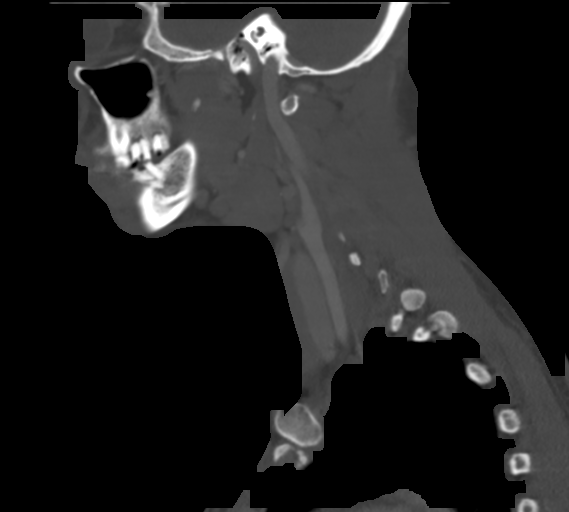

[Series 9: orthogonal st · axial · 0.39mm/px · z∈[-228,-46]mm · 5 of 149 slices shown, 7 images]
[im 25/149  soft-tissue]
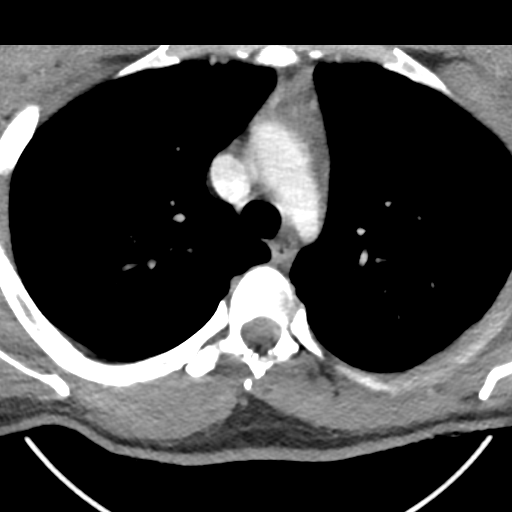
[im 25/149  bone]
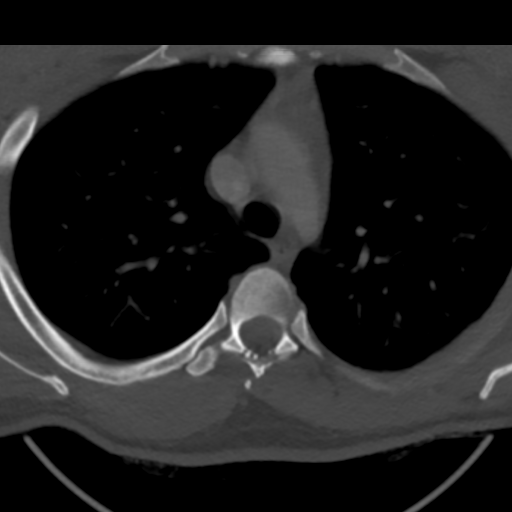
[im 50/149  bone]
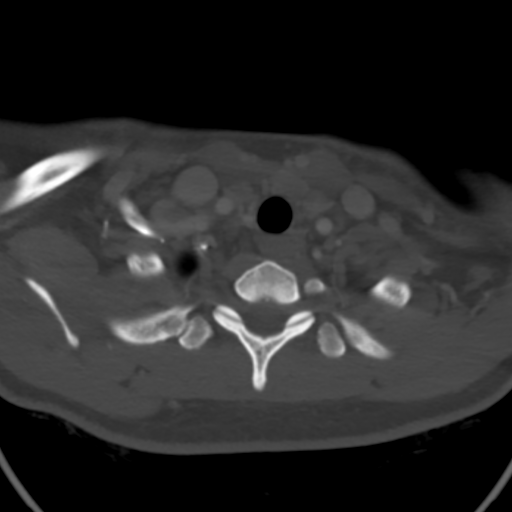
[im 75/149  bone]
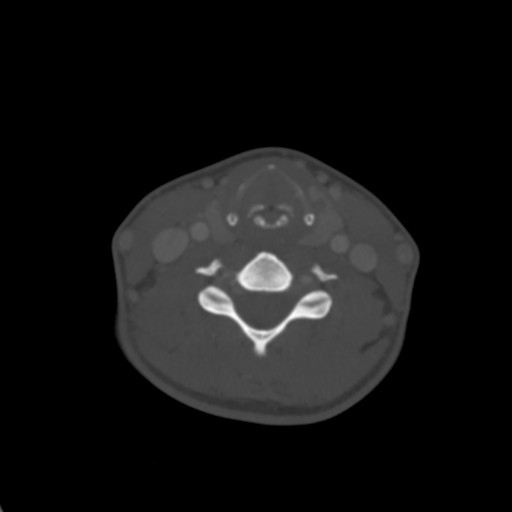
[im 99/149  bone]
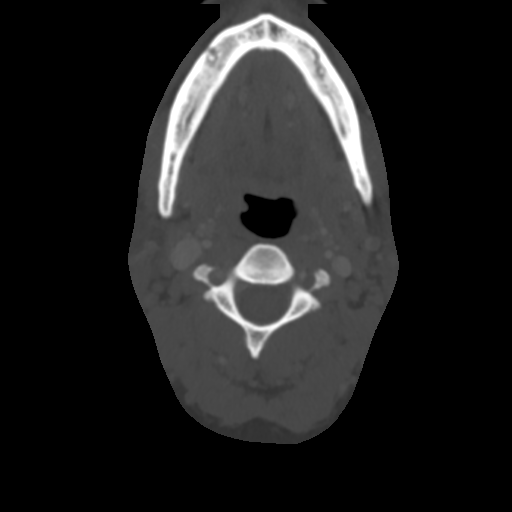
[im 124/149  soft-tissue]
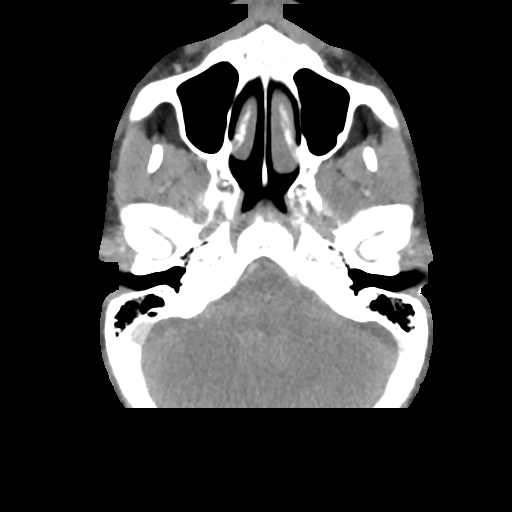
[im 124/149  bone]
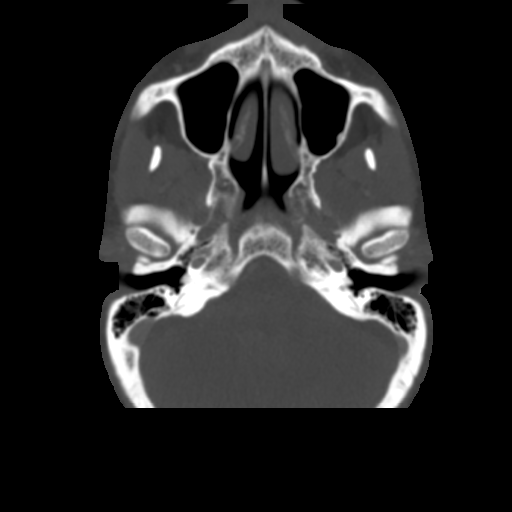

[15 of 33 positions shown; findings below may reference images not displayed]

FINDINGS: Pharynx and larynx:

Poor dentition with multiple carious teeth. There is mild asymmetric
prominence of the right palatine tonsil and of soft tissue within
the right glossotonsillar sulcus. No evidence of peritonsillar
abscess. No convincing evidence of epiglottitis on the current exam.
No appreciable mass or swelling within the larynx. No airway
compromise.

Salivary glands: No evidence of inflammation, mass or stone.

Thyroid: Negative

Lymph nodes: No pathologically enlarged cervical chain lymph nodes.

Vascular: The major vascular structures of the neck appear patent.

Limited intracranial: No abnormality identified

Visualized orbits: No evidence of acute abnormality.

Mastoids and visualized paranasal sinuses: No significant paranasal
sinus disease or mastoid effusion at the imaged levels.

Skeleton: No acute abnormality or suspicious osseous lesion.

Upper chest: No consolidation within the imaged lung apices.
IMPRESSION: Mild asymmetric prominence of the right palatine tonsil and of soft
tissue within the right glossotonsillar sulcus. Findings may reflect
sequela of tonsillitis given provided history. However, close
clinical follow-up recommended with repeat imaging if warranted to
exclude alternative etiologies (i.e. Malignancy). No evidence of
peritonsillar abscess. No significant airway compromise.

## 2022-01-02 DIAGNOSIS — T7411XA Adult physical abuse, confirmed, initial encounter: Secondary | ICD-10-CM | POA: Insufficient documentation

## 2022-01-02 DIAGNOSIS — F332 Major depressive disorder, recurrent severe without psychotic features: Secondary | ICD-10-CM | POA: Insufficient documentation

## 2022-01-04 DIAGNOSIS — E559 Vitamin D deficiency, unspecified: Secondary | ICD-10-CM | POA: Insufficient documentation

## 2022-01-04 DIAGNOSIS — L309 Dermatitis, unspecified: Secondary | ICD-10-CM | POA: Insufficient documentation

## 2022-01-04 DIAGNOSIS — F4312 Post-traumatic stress disorder, chronic: Secondary | ICD-10-CM | POA: Insufficient documentation

## 2022-01-04 DIAGNOSIS — F1491 Cocaine use, unspecified, in remission: Secondary | ICD-10-CM | POA: Insufficient documentation

## 2022-01-04 DIAGNOSIS — F1220 Cannabis dependence, uncomplicated: Secondary | ICD-10-CM | POA: Diagnosis present

## 2022-04-09 ENCOUNTER — Emergency Department (HOSPITAL_COMMUNITY)
Admission: EM | Admit: 2022-04-09 | Discharge: 2022-04-09 | Disposition: A | Discharge status: Home / Self Care | Payer: Commercial Managed Care - HMO | Attending: Emergency Medicine | Admitting: Emergency Medicine

## 2022-04-09 ENCOUNTER — Other Ambulatory Visit: Payer: Self-pay

## 2022-04-09 ENCOUNTER — Encounter (HOSPITAL_COMMUNITY): Payer: Self-pay

## 2022-04-09 DIAGNOSIS — R21 Rash and other nonspecific skin eruption: Secondary | ICD-10-CM | POA: Insufficient documentation

## 2022-04-09 MED ORDER — OXYCODONE HCL 5 MG PO TABS: 5.0000 mg | ORAL_TABLET | Freq: Four times a day (QID) | ORAL | 0 refills | Status: DC | PRN

## 2022-04-09 MED ORDER — OXYCODONE HCL 5 MG PO TABS
5.0000 mg | ORAL_TABLET | Freq: Once | ORAL | Status: AC
  Administered 2022-04-09: 5 mg via ORAL
  Filled 2022-04-09: qty 1

## 2022-04-09 MED ORDER — DOXYCYCLINE HYCLATE 100 MG PO TABS
100.0000 mg | ORAL_TABLET | Freq: Once | ORAL | Status: AC
  Administered 2022-04-09: 100 mg via ORAL
  Filled 2022-04-09: qty 1

## 2022-04-09 MED ORDER — DOXYCYCLINE HYCLATE 100 MG PO CAPS: 100.0000 mg | ORAL_CAPSULE | Freq: Two times a day (BID) | ORAL | 0 refills | Status: DC

## 2022-04-09 NOTE — ED Triage Notes (Signed)
Patient stated that she has a chronic skin condition where her hands and feet have been peeling. But the past couple of days the skin rash has spread and now is weeping. She said it has become painful and she cannot walk now because the bottoms of her feet hurt so bad.

## 2022-04-09 NOTE — ED Provider Notes (Signed)
[facility]-EMERGENCY DEPT Provider Note   CSN: [id number] Arrival date & time: 04/09/22  2138     History  Chief Complaint  Patient presents with   Skin Problem    Ebony Hansen is a 41 y.o. female.  Patient here with acute on chronic rash.  Suspected eczema.  She is on some cream that was provided by her primary care doctor.  She just finished a steroid taper.  She has referral to dermatology in 2 weeks.  History of allergies and suspected eczema.  She is having continued discomfort especially in the bottom of her feet and hands.  Denies any fevers or chills.  Concern for may be infection in her feet.  Denies any weakness, numbness.  The history is provided by the patient.       Home Medications Prior to Admission medications   Medication Sig Start Date End Date Taking? Authorizing Provider  doxycycline (VIBRAMYCIN) 100 MG capsule Take 1 capsule (100 mg total) by mouth 2 (two) times daily. 04/09/22  Yes Denna Fryberger, DO  oxyCODONE (ROXICODONE) 5 MG immediate release tablet Take 1 tablet (5 mg total) by mouth every 6 (six) hours as needed for up to 20 doses for breakthrough pain. 04/09/22  Yes Huxton Glaus, DO  ADDERALL XR 25 MG 24 hr capsule Take 25 mg by mouth every morning.  07/05/19   [provider]  albuterol (VENTOLIN HFA) 108 (90 Base) MCG/ACT inhaler Inhale 2 puffs into the lungs every 6 (six) hours as needed for wheezing or shortness of breath.     [provider]  famotidine (PEPCID) 20 MG tablet Take 1 tablet (20 mg total) by mouth daily. 07/27/19   Joseph Art, DO  ibuprofen (ADVIL) 200 MG tablet Take 800 mg by mouth every 6 (six) hours as needed for headache.    [provider]  menthol-cetylpyridinium (CEPACOL) 3 MG lozenge Take 1 lozenge (3 mg total) by mouth as needed for sore throat. 07/27/19   Joseph Art, DO  Multiple Vitamins-Minerals (AIRBORNE GUMMIES) CHEW Chew 1-4 tablets by mouth 3 (three) times  daily as needed (for immune support).    [provider]  predniSONE (DELTASONE) 10 MG tablet Take 5 on day 1  Take 4 on day 2  Take 3 on day 3 Take 2 on day 4 Take 1 on day 5 07/27/19   Marlin Canary U, DO  traMADol (ULTRAM) 50 MG tablet Take 50 mg by mouth 2 (two) times daily as needed (for pain).  07/22/19   [provider]      Allergies    Shellfish allergy, Sulfa drugs cross reactors, and Sulfasalazine    Review of Systems   Review of Systems  Physical Exam Updated Vital Signs BP (!) 144/90 (BP Location: Right Wrist)   Pulse (!) 105   Temp 98 F (36.7 C) (Oral)   Resp 18   Ht 5\' 3"  (1.6 m)   Wt 70.3 kg   SpO2 98%   BMI 27.46 kg/m  Physical Exam Vitals and nursing note reviewed.  Constitutional:      General: She is not in acute distress.    Appearance: She is well-developed.  HENT:     Head: Normocephalic and atraumatic.  Eyes:     Conjunctiva/sclera: Conjunctivae normal.  Cardiovascular:     Rate and Rhythm: Normal rate and regular rhythm.     Heart sounds: No murmur heard. Pulmonary:     Effort: Pulmonary effort is  normal. No respiratory distress.     Breath sounds: Normal breath sounds.  Abdominal:     Palpations: Abdomen is soft.     Tenderness: There is no abdominal tenderness.  Musculoskeletal:        General: No swelling.     Cervical back: Neck supple.  Skin:    General: Skin is warm and dry.     Capillary Refill: Capillary refill takes less than 2 seconds.     Findings: Rash present.     Comments: She has some areas of skin ulceration on the bottom of her feet bilaterally, rash on her hands and feet appear consistent with eczema, also on the back of her knees and in the flexor portions of her elbows but with not as much skin breakdown  Neurological:     Mental Status: She is alert.  Psychiatric:        Mood and Affect: Mood normal.     ED Results / Procedures / Treatments   Labs (all labs ordered are listed, but only  abnormal results are displayed) Labs Reviewed - No data to display  EKG None  Radiology No results found.  Procedures Procedures    Medications Ordered in ED Medications  oxyCODONE (Oxy IR/ROXICODONE) immediate release tablet 5 mg (has no administration in time range)  doxycycline (VIBRA-TABS) tablet 100 mg (has no administration in time range)    ED Course/ Medical Decision Making/ A&P                           Medical Decision Making Risk Prescription drug management.   Ceaira A Riendeau is here with rash.  She appears to have pretty fairly significant eczema of her hands and feet and a little bit in her flexor surfaces of her elbows and the back of her knees.  Just finished a steroid taper with some improvement.  She is on some specialized cream that her primary care doctor provided.  She has follow-up with dermatologist here soon.  There may be a mild cellulitis in the bottom of her feet but overall this appears to be chronic skin breakdown.  Conservatively will treat with doxycycline.  Will prescribe Roxicodone for further pain management.  She is neurovascular neurovascularly intact on exam.  She has no signs of anaphylaxis.  There is no swelling of the tongue or lips.  This does not look allergic in reaction.  Overall this is suspected eczema/inflammatory process.  Discharged in good condition.  Understands return precautions.  This chart was dictated using voice recognition software.  Despite best efforts to proofread,  errors can occur which can change the documentation meaning.         Final Clinical Impression(s) / ED Diagnoses Final diagnoses:  Rash    Rx / DC Orders ED Discharge Orders          Ordered    Ambulatory referral to Dermatology        04/09/22 2230    doxycycline (VIBRAMYCIN) 100 MG capsule  2 times daily        04/09/22 2231    oxyCODONE (ROXICODONE) 5 MG immediate release tablet  Every 6 hours PRN        04/09/22 2231               Virgina Norfolk, DO 04/09/22 2233

## 2022-05-02 ENCOUNTER — Encounter (HOSPITAL_COMMUNITY): Payer: Self-pay | Admitting: *Deleted

## 2022-05-02 ENCOUNTER — Emergency Department (HOSPITAL_COMMUNITY): Payer: Commercial Managed Care - HMO

## 2022-05-02 ENCOUNTER — Other Ambulatory Visit: Payer: Self-pay

## 2022-05-02 ENCOUNTER — Emergency Department (HOSPITAL_COMMUNITY)
Admission: EM | Admit: 2022-05-02 | Discharge: 2022-05-02 | Disposition: A | Discharge status: Home / Self Care | Payer: Commercial Managed Care - HMO | Attending: Emergency Medicine | Admitting: Emergency Medicine

## 2022-05-02 DIAGNOSIS — M549 Dorsalgia, unspecified: Secondary | ICD-10-CM | POA: Insufficient documentation

## 2022-05-02 DIAGNOSIS — R1084 Generalized abdominal pain: Secondary | ICD-10-CM | POA: Insufficient documentation

## 2022-05-02 DIAGNOSIS — L309 Dermatitis, unspecified: Secondary | ICD-10-CM | POA: Insufficient documentation

## 2022-05-02 DIAGNOSIS — R110 Nausea: Secondary | ICD-10-CM | POA: Insufficient documentation

## 2022-05-02 LAB — URINALYSIS, ROUTINE W REFLEX MICROSCOPIC
Bilirubin Urine: NEGATIVE
Glucose, UA: NEGATIVE mg/dL
Hgb urine dipstick: NEGATIVE
Ketones, ur: NEGATIVE mg/dL
Leukocytes,Ua: NEGATIVE
Nitrite: NEGATIVE
Protein, ur: NEGATIVE mg/dL
Specific Gravity, Urine: 1.025 (ref 1.005–1.030)
pH: 6 (ref 5.0–8.0)

## 2022-05-02 LAB — COMPREHENSIVE METABOLIC PANEL
ALT: 34 U/L (ref 0–44)
AST: 37 U/L (ref 15–41)
Albumin: 4 g/dL (ref 3.5–5.0)
Alkaline Phosphatase: 81 U/L (ref 38–126)
Anion gap: 8 (ref 5–15)
BUN: 10 mg/dL (ref 6–20)
CO2: 25 mmol/L (ref 22–32)
Calcium: 8.9 mg/dL (ref 8.9–10.3)
Chloride: 108 mmol/L (ref 98–111)
Creatinine, Ser: 0.77 mg/dL (ref 0.44–1.00)
GFR, Estimated: >60 mL/min (ref >60)
Glucose, Bld: 99 mg/dL (ref 70–99)
Potassium: 3.8 mmol/L (ref 3.5–5.1)
Sodium: 141 mmol/L (ref 135–145)
Total Bilirubin: 0.2 mg/dL — ABNORMAL LOW (ref 0.3–1.2)
Total Protein: 7.7 g/dL (ref 6.5–8.1)

## 2022-05-02 LAB — I-STAT BETA HCG BLOOD, ED (MC, WL, AP ONLY): I-stat hCG, quantitative: <5 m[IU]/mL (ref <5)

## 2022-05-02 LAB — CBC
HCT: 37 % (ref 36.0–46.0)
Hemoglobin: 11.5 g/dL — ABNORMAL LOW (ref 12.0–15.0)
MCH: 24.6 pg — ABNORMAL LOW (ref 26.0–34.0)
MCHC: 31.1 g/dL (ref 30.0–36.0)
MCV: 79.1 fL — ABNORMAL LOW (ref 80.0–100.0)
Platelets: 246 10*3/uL (ref 150–400)
RBC: 4.68 MIL/uL (ref 3.87–5.11)
RDW: 24.1 % — ABNORMAL HIGH (ref 11.5–15.5)
WBC: 6.9 10*3/uL (ref 4.0–10.5)
nRBC: 0 % (ref 0.0–0.2)

## 2022-05-02 LAB — LIPASE, BLOOD: Lipase: 36 U/L (ref 11–51)

## 2022-05-02 MED ORDER — ONDANSETRON HCL 4 MG/2ML IJ SOLN
4.0000 mg | Freq: Once | INTRAMUSCULAR | Status: AC
  Administered 2022-05-02: 4 mg via INTRAVENOUS
  Filled 2022-05-02: qty 2

## 2022-05-02 MED ORDER — PANTOPRAZOLE SODIUM 40 MG PO TBEC: 40.0000 mg | DELAYED_RELEASE_TABLET | Freq: Two times a day (BID) | ORAL | 0 refills | Status: DC

## 2022-05-02 MED ORDER — SODIUM CHLORIDE 0.9 % IV BOLUS
1000.0000 mL | Freq: Once | INTRAVENOUS | Status: AC
  Administered 2022-05-02: 1000 mL via INTRAVENOUS

## 2022-05-02 MED ORDER — MORPHINE SULFATE (PF) 4 MG/ML IV SOLN
6.0000 mg | INTRAVENOUS | Status: DC | PRN
  Administered 2022-05-02: 6 mg via INTRAVENOUS
  Filled 2022-05-02: qty 2

## 2022-05-02 MED ORDER — IOHEXOL 300 MG/ML  SOLN
100.0000 mL | Freq: Once | INTRAMUSCULAR | Status: AC | PRN
  Administered 2022-05-02: 100 mL via INTRAVENOUS

## 2022-05-02 MED ORDER — SUCRALFATE 1 G PO TABS: 1.0000 g | ORAL_TABLET | Freq: Three times a day (TID) | ORAL | 0 refills | Status: AC

## 2022-05-02 MED ORDER — MORPHINE SULFATE 15 MG PO TABS
15.0000 mg | ORAL_TABLET | ORAL | Status: DC | PRN
  Administered 2022-05-02: 15 mg via ORAL
  Filled 2022-05-02: qty 1

## 2022-05-02 MED ORDER — PANTOPRAZOLE SODIUM 40 MG PO TBEC
40.0000 mg | DELAYED_RELEASE_TABLET | Freq: Once | ORAL | Status: AC
  Administered 2022-05-02: 40 mg via ORAL
  Filled 2022-05-02: qty 1

## 2022-05-02 NOTE — Discharge Instructions (Signed)
The CT scan of your abdomen is reassuring.  No evidence of any internal infection, obstruction, bleeding.  Please follow-up with the rheumatologist and dermatologist for optimal management of eczema.  For now we recommend continued moisturizer use.  Please start taking Protonix instead of famotidine along with Carafate that is prescribed to ensure that your stomach lining is preserved.  Decrease the amount of ibuprofen use.  Follow-up with your primary care doctor for abdominal pain.

## 2022-05-02 NOTE — ED Provider Notes (Signed)
[facility]-EMERGENCY DEPT Provider Note   CSN: [id number] Arrival date & time: 05/02/22  1517     History {Add pertinent medical, surgical, social history, OB history to HPI:1} Chief Complaint  Patient presents with   Eczema   Abdominal Pain   Back Pain    Ebony Hansen is a 41 y.o. female.  HPI     41 year old female comes in with chief complaint of abdominal pain, back pain, eczema.  Home Medications Prior to Admission medications   Medication Sig Start Date End Date Taking? Authorizing Provider  pantoprazole (PROTONIX) 40 MG tablet Take 1 tablet (40 mg total) by mouth 2 (two) times daily before a meal. 05/02/22  Yes Randen Kauth, MD  sucralfate (CARAFATE) 1 g tablet Take 1 tablet (1 g total) by mouth 4 (four) times daily -  with meals and at bedtime. 05/02/22  Yes Chistian Kasler, MD  ADDERALL XR 25 MG 24 hr capsule Take 25 mg by mouth every morning.  07/05/19   [provider]  albuterol (VENTOLIN HFA) 108 (90 Base) MCG/ACT inhaler Inhale 2 puffs into the lungs every 6 (six) hours as needed for wheezing or shortness of breath.     [provider]  doxycycline (VIBRAMYCIN) 100 MG capsule Take 1 capsule (100 mg total) by mouth 2 (two) times daily. 04/09/22   Curatolo, Adam, DO  famotidine (PEPCID) 20 MG tablet Take 1 tablet (20 mg total) by mouth daily. 07/27/19   Joseph Art, DO  ibuprofen (ADVIL) 200 MG tablet Take 800 mg by mouth every 6 (six) hours as needed for headache.    [provider]  menthol-cetylpyridinium (CEPACOL) 3 MG lozenge Take 1 lozenge (3 mg total) by mouth as needed for sore throat. 07/27/19   Joseph Art, DO  Multiple Vitamins-Minerals (AIRBORNE GUMMIES) CHEW Chew 1-4 tablets by mouth 3 (three) times daily as needed (for immune support).    [provider]  oxyCODONE (ROXICODONE) 5 MG immediate release tablet Take 1 tablet (5 mg total) by mouth every 6 (six) hours as needed for up to 20  doses for breakthrough pain. 04/09/22   Curatolo, Adam, DO  predniSONE (DELTASONE) 10 MG tablet Take 5 on day 1  Take 4 on day 2  Take 3 on day 3 Take 2 on day 4 Take 1 on day 5 07/27/19   Marlin Canary U, DO  traMADol (ULTRAM) 50 MG tablet Take 50 mg by mouth 2 (two) times daily as needed (for pain).  07/22/19   [provider]      Allergies    Shellfish allergy, Sulfa drugs cross reactors, and Sulfasalazine    Review of Systems   Review of Systems  Physical Exam Updated Vital Signs BP 132/81   Pulse 71   Temp 98.4 F (36.9 C) (Oral)   Resp 17   Ht 5\' 3"  (1.6 m)   Wt 79.8 kg   SpO2 100%   BMI 31.18 kg/m  Physical Exam  ED Results / Procedures / Treatments   Labs (all labs ordered are listed, but only abnormal results are displayed) Labs Reviewed  COMPREHENSIVE METABOLIC PANEL - Abnormal; Notable for the following components:      Result Value   Total Bilirubin 0.2 (*)    All other components within normal limits  CBC - Abnormal; Notable for the following components:   Hemoglobin 11.5 (*)    MCV 79.1 (*)    MCH 24.6 (*)    RDW  24.1 (*)    All other components within normal limits  LIPASE, BLOOD  URINALYSIS, ROUTINE W REFLEX MICROSCOPIC  I-STAT BETA HCG BLOOD, ED ([facility], [facility], [facility] ONLY)    EKG None  Radiology CT ABDOMEN PELVIS W CONTRAST  Result Date: 05/02/2022 CLINICAL DATA:  Acute abdominal pain with diarrhea, initial encounter EXAM: CT ABDOMEN AND PELVIS WITH CONTRAST TECHNIQUE: Multidetector CT imaging of the abdomen and pelvis was performed using the standard protocol following bolus administration of intravenous contrast. RADIATION DOSE REDUCTION: This exam was performed according to the departmental dose-optimization program which includes automated exposure control, adjustment of the mA and/or kV according to patient size and/or use of iterative reconstruction technique. CONTRAST:  OMNIPAQUE IOHEXOL 300 MG/ML  SOLN COMPARISON:  None Available.  FINDINGS: Lower chest: Minimal pleural effusions are noted bilaterally. No focal infiltrate or parenchymal nodules are seen. Hepatobiliary: No focal liver abnormality is seen. No gallstones, gallbladder wall thickening, or biliary dilatation. Pancreas: Unremarkable. No pancreatic ductal dilatation or surrounding inflammatory changes. Spleen: Normal in size without focal abnormality. Adrenals/Urinary Tract: Adrenal glands are within normal limits. Kidneys are well visualized bilaterally. No renal calculi or obstructive changes are noted. Bladder is within normal limits. Stomach/Bowel: No obstructive or inflammatory changes of the colon are seen. Fecal material is noted throughout the colon without significant constipation. The appendix is well visualized and within normal limits. No inflammatory changes are noted. The stomach and small bowel are unremarkable. Vascular/Lymphatic: No significant vascular findings are present. No enlarged abdominal or pelvic lymph nodes. Retroaortic left renal vein is noted. Reproductive: Uterus and bilateral adnexa are unremarkable. Other: Minimal free pelvic fluid is noted likely physiologic in nature. No focal hernia is noted. Musculoskeletal: No acute or significant osseous findings. IMPRESSION: Normal-appearing appendix. Minimal bilateral effusions without focal infiltrate. No other focal abnormality is noted. Electronically Signed   By: Alcide Clever M.D.   On: 05/02/2022 18:58    Procedures Procedures  {Document cardiac monitor, telemetry assessment procedure when appropriate:1}  Medications Ordered in ED Medications  morphine (PF) 4 MG/ML injection 6 mg (6 mg Intravenous Given 05/02/22 1837)  morphine (MSIR) tablet 15 mg (15 mg Oral Given 05/02/22 2202)  sodium chloride 0.9 % bolus 1,000 mL (0 mLs Intravenous Stopped 05/02/22 2200)  ondansetron (ZOFRAN) injection 4 mg (4 mg Intravenous Given 05/02/22 1834)  iohexol (OMNIPAQUE) 300 MG/ML solution 100 mL (100 mLs  Intravenous Contrast Given 05/02/22 1841)  pantoprazole (PROTONIX) EC tablet 40 mg (40 mg Oral Given 05/02/22 2202)    ED Course/ Medical Decision Making/ A&P                           Medical Decision Making Amount and/or Complexity of Data Reviewed Labs: ordered. Radiology: ordered.  Risk Prescription drug management.   ***  {Document critical care time when appropriate:1} {Document review of labs and clinical decision tools ie heart score, Chads2Vasc2 etc:1}  {Document your independent review of radiology images, and any outside records:1} {Document your discussion with family members, caretakers, and with consultants:1} {Document social determinants of health affecting pt's care:1} {Document your decision making why or why not admission, treatments were needed:1} Final Clinical Impression(s) / ED Diagnoses Final diagnoses:  Generalized abdominal pain  Eczema, unspecified type    Rx / DC Orders ED Discharge Orders          Ordered    pantoprazole (PROTONIX) 40 MG tablet  2 times daily before meals  05/02/22 2148    sucralfate (CARAFATE) 1 g tablet  3 times daily with meals & bedtime        05/02/22 2148

## 2022-05-02 NOTE — ED Triage Notes (Signed)
Pt present with eczema on feet and hand, mucus in back of throat. Abd pain with Diarrhea with black coffee ground appearance. Metallic taste in mouth.

## 2022-05-11 ENCOUNTER — Emergency Department (HOSPITAL_COMMUNITY)
Admission: EM | Admit: 2022-05-11 | Discharge: 2022-05-12 | Disposition: A | Discharge status: Home / Self Care | Payer: Commercial Managed Care - HMO | Attending: Student | Admitting: Student

## 2022-05-11 ENCOUNTER — Encounter (HOSPITAL_COMMUNITY): Payer: Self-pay

## 2022-05-11 ENCOUNTER — Emergency Department (HOSPITAL_COMMUNITY): Payer: Commercial Managed Care - HMO

## 2022-05-11 DIAGNOSIS — E876 Hypokalemia: Secondary | ICD-10-CM | POA: Insufficient documentation

## 2022-05-11 DIAGNOSIS — R0789 Other chest pain: Secondary | ICD-10-CM | POA: Diagnosis not present

## 2022-05-11 DIAGNOSIS — I10 Essential (primary) hypertension: Secondary | ICD-10-CM | POA: Insufficient documentation

## 2022-05-11 DIAGNOSIS — Z20822 Contact with and (suspected) exposure to covid-19: Secondary | ICD-10-CM | POA: Insufficient documentation

## 2022-05-11 DIAGNOSIS — R0602 Shortness of breath: Secondary | ICD-10-CM | POA: Diagnosis not present

## 2022-05-11 DIAGNOSIS — R0981 Nasal congestion: Secondary | ICD-10-CM | POA: Diagnosis not present

## 2022-05-11 DIAGNOSIS — R051 Acute cough: Secondary | ICD-10-CM | POA: Insufficient documentation

## 2022-05-11 DIAGNOSIS — R062 Wheezing: Secondary | ICD-10-CM | POA: Insufficient documentation

## 2022-05-11 LAB — TROPONIN I (HIGH SENSITIVITY): Troponin I (High Sensitivity): 3 ng/L (ref <18)

## 2022-05-11 LAB — BASIC METABOLIC PANEL
Anion gap: 9 (ref 5–15)
BUN: 9 mg/dL (ref 6–20)
CO2: 22 mmol/L (ref 22–32)
Calcium: 8.7 mg/dL — ABNORMAL LOW (ref 8.9–10.3)
Chloride: 110 mmol/L (ref 98–111)
Creatinine, Ser: 0.7 mg/dL (ref 0.44–1.00)
GFR, Estimated: >60 mL/min (ref >60)
Glucose, Bld: 80 mg/dL (ref 70–99)
Potassium: 3.1 mmol/L — ABNORMAL LOW (ref 3.5–5.1)
Sodium: 141 mmol/L (ref 135–145)

## 2022-05-11 LAB — RESP PANEL BY RT-PCR (FLU A&B, COVID) ARPGX2
Influenza A by PCR: NEGATIVE
Influenza B by PCR: NEGATIVE
SARS Coronavirus 2 by RT PCR: NEGATIVE

## 2022-05-11 LAB — CBC
HCT: 32.5 % — ABNORMAL LOW (ref 36.0–46.0)
Hemoglobin: 10.3 g/dL — ABNORMAL LOW (ref 12.0–15.0)
MCH: 24.9 pg — ABNORMAL LOW (ref 26.0–34.0)
MCHC: 31.7 g/dL (ref 30.0–36.0)
MCV: 78.7 fL — ABNORMAL LOW (ref 80.0–100.0)
Platelets: 311 10*3/uL (ref 150–400)
RBC: 4.13 MIL/uL (ref 3.87–5.11)
RDW: 23.4 % — ABNORMAL HIGH (ref 11.5–15.5)
WBC: 9.3 10*3/uL (ref 4.0–10.5)
nRBC: 0 % (ref 0.0–0.2)

## 2022-05-11 LAB — I-STAT BETA HCG BLOOD, ED (MC, WL, AP ONLY): I-stat hCG, quantitative: <5 m[IU]/mL (ref <5)

## 2022-05-11 NOTE — ED Provider Triage Note (Signed)
Emergency Medicine Provider Triage Evaluation Note  Ebony Hansen , a 41 y.o. female  was evaluated in triage.  Pt complains of nasal congestion, cough, and SOB for the past week. The patient is on steroids and antibiotics from her rhem and derm providers.  Review of Systems  Positive:  Negative:   Physical Exam  BP (!) 166/89 (BP Location: Left Arm)   Pulse 84   Temp 98.5 F (36.9 C) (Oral)   Resp 17   Ht 5\' 3"  (1.6 m)   Wt 81.6 kg   SpO2 100%   BMI 31.89 kg/m  Gen:   Awake, no distress   Resp:  Normal effort  MSK:   Moves extremities without difficulty  Other:  Wheezing  Medical Decision Making  Medically screening exam initiated at 7:27 PM.  Appropriate orders placed.  Anselma A Royals was informed that the remainder of the evaluation will be completed by another provider, this initial triage assessment does not replace that evaluation, and the importance of remaining in the ED until their evaluation is complete.  COVID ordered   , PA-C 05/11/22 1932

## 2022-05-11 NOTE — ED Triage Notes (Signed)
Pt complaining of chest pain. Pt reports that she is being treated for rash with steroids and abx from dermatologist. Pt is wheezy and complaining of post nasal drip.

## 2022-05-12 MED ORDER — BENZONATATE 100 MG PO CAPS: 100.0000 mg | ORAL_CAPSULE | Freq: Three times a day (TID) | ORAL | 0 refills | Status: DC

## 2022-05-12 MED ORDER — ALBUTEROL SULFATE HFA 108 (90 BASE) MCG/ACT IN AERS
2.0000 | INHALATION_SPRAY | Freq: Once | RESPIRATORY_TRACT | Status: AC
  Administered 2022-05-12: 2 via RESPIRATORY_TRACT
  Filled 2022-05-12: qty 6.7

## 2022-05-12 NOTE — Discharge Instructions (Signed)
You were seen in the emergency department today for chest pain and cough.  As we discussed you were wheezing.  I think that this could possibly be related to a viral infection such as bronchitis, or you could have underlying asthma.  This would be common with your history of eczema.  We have given you an inhaler that you can use 1 to 2 puffs every 4-6 hours as needed for chest tightness or shortness of breath.  I have also prescribed you a cough medicine.  Lastly, I have sent a referral to the pulmonologist for you and attached their contact information --for you to contact if you do not hear from them by the end of the week.  Your laboratory work-up and chest x-ray all looked reassuring today.  Continue to monitor how you're doing and return to the ER for new or worsening symptoms.

## 2022-05-12 NOTE — ED Provider Notes (Signed)
[facility]-EMERGENCY DEPT Provider Note   CSN: [id number] Arrival date & time: 05/11/22  1909     History  Chief Complaint  Patient presents with   Chest Pain    Ebony Hansen is a 41 y.o. female with history of sickle cell trait, anxiety, ADHD, and hypertension who presents the emergency department complaining of chest discomfort for the past week.  Patient states that she is currently being treated for a rash with steroids antibiotics from her dermatologist and rheumatologist.  She is also been having symptoms of nasal congestion, cough, shortness of breath for the same amount of time.  She has previously needed inhalers for what she thought could have been an asthma when she was younger, but has never been formally tested.  States that her symptoms are worse during flu season as she works as a Runner, broadcasting/film/video, as well as associated with seasonal allergies.  She is complaining of chest tightness worse on the right side of her chest, and soreness likely from coughing.  She feels her symptoms are worse when she lays down at night and she has significant postnasal drip.  Denies any fevers or chills.   Chest Pain Associated symptoms: cough and shortness of breath   Associated symptoms: no dysphagia and no fever        Home Medications Prior to Admission medications   Medication Sig Start Date End Date Taking? Authorizing Provider  benzonatate (TESSALON) 100 MG capsule Take 1 capsule (100 mg total) by mouth every 8 (eight) hours. 05/12/22  Yes Claxton Levitz T, PA-C  ADDERALL XR 25 MG 24 hr capsule Take 25 mg by mouth every morning.  07/05/19   [provider]  albuterol (VENTOLIN HFA) 108 (90 Base) MCG/ACT inhaler Inhale 2 puffs into the lungs every 6 (six) hours as needed for wheezing or shortness of breath.     [provider]  doxycycline (VIBRAMYCIN) 100 MG capsule Take 1 capsule (100 mg total) by mouth 2 (two) times daily. 04/09/22   Curatolo,  Adam, DO  famotidine (PEPCID) 20 MG tablet Take 1 tablet (20 mg total) by mouth daily. 07/27/19   Joseph Art, DO  ibuprofen (ADVIL) 200 MG tablet Take 800 mg by mouth every 6 (six) hours as needed for headache.    [provider]  menthol-cetylpyridinium (CEPACOL) 3 MG lozenge Take 1 lozenge (3 mg total) by mouth as needed for sore throat. 07/27/19   Joseph Art, DO  Multiple Vitamins-Minerals (AIRBORNE GUMMIES) CHEW Chew 1-4 tablets by mouth 3 (three) times daily as needed (for immune support).    [provider]  oxyCODONE (ROXICODONE) 5 MG immediate release tablet Take 1 tablet (5 mg total) by mouth every 6 (six) hours as needed for up to 20 doses for breakthrough pain. 04/09/22   Curatolo, Adam, DO  pantoprazole (PROTONIX) 40 MG tablet Take 1 tablet (40 mg total) by mouth 2 (two) times daily before a meal. 05/02/22   Derwood Kaplan, MD  predniSONE (DELTASONE) 10 MG tablet Take 5 on day 1  Take 4 on day 2  Take 3 on day 3 Take 2 on day 4 Take 1 on day 5 07/27/19   Marlin Canary U, DO  sucralfate (CARAFATE) 1 g tablet Take 1 tablet (1 g total) by mouth 4 (four) times daily -  with meals and at bedtime. 05/02/22   Derwood Kaplan, MD  traMADol (ULTRAM) 50 MG tablet Take 50 mg by mouth 2 (two) times daily as  needed (for pain).  07/22/19   [provider]      Allergies    Shellfish allergy, Sulfa drugs cross reactors, and Sulfasalazine    Review of Systems   Review of Systems  Constitutional:  Negative for chills and fever.  HENT:  Positive for congestion and rhinorrhea. Negative for sore throat and trouble swallowing.   Respiratory:  Positive for cough and shortness of breath.   Cardiovascular:  Positive for chest pain.  All other systems reviewed and are negative.   Physical Exam Updated Vital Signs BP (!) 166/89 (BP Location: Left Arm)   Pulse 84   Temp 98.5 F (36.9 C) (Oral)   Resp 17   Ht 5\' 3"  (1.6 m)   Wt 81.6 kg   LMP 04/07/2022   SpO2 100%    BMI 31.87 kg/m  Physical Exam Vitals and nursing note reviewed.  Constitutional:      Appearance: Normal appearance.  HENT:     Head: Normocephalic and atraumatic.     Nose: Congestion present.  Eyes:     Conjunctiva/sclera: Conjunctivae normal.  Cardiovascular:     Rate and Rhythm: Normal rate and regular rhythm.  Pulmonary:     Effort: Pulmonary effort is normal. No respiratory distress.     Breath sounds: Wheezing present.     Comments: Mild inspiratory and expiratory wheezing to auscultation Abdominal:     General: There is no distension.     Palpations: Abdomen is soft.     Tenderness: There is no abdominal tenderness.  Skin:    General: Skin is warm and dry.     Comments: Dry eczematoid skin on bilateral hands  Neurological:     General: No focal deficit present.     Mental Status: She is alert.     ED Results / Procedures / Treatments   Labs (all labs ordered are listed, but only abnormal results are displayed) Labs Reviewed  BASIC METABOLIC PANEL - Abnormal; Notable for the following components:      Result Value   Potassium 3.1 (*)    Calcium 8.7 (*)    All other components within normal limits  CBC - Abnormal; Notable for the following components:   Hemoglobin 10.3 (*)    HCT 32.5 (*)    MCV 78.7 (*)    MCH 24.9 (*)    RDW 23.4 (*)    All other components within normal limits  RESP PANEL BY RT-PCR (FLU A&B, COVID) [id number]  I-STAT BETA HCG BLOOD, ED ([facility], [facility], [facility] ONLY)  TROPONIN I (HIGH SENSITIVITY)    EKG None  Radiology DG Chest 2 View  Result Date: 05/11/2022 CLINICAL DATA:  Chest pain EXAM: CHEST - 2 VIEW COMPARISON:  11/12/2020 FINDINGS: The heart size and mediastinal contours are within normal limits. Both lungs are clear. The visualized skeletal structures are unremarkable. IMPRESSION: Negative. Electronically Signed   By: 11/14/2020 M.D.   On: 05/11/2022 20:10    Procedures Procedures    Medications Ordered in ED Medications   albuterol (VENTOLIN HFA) 108 (90 Base) MCG/ACT inhaler 2 puff (has no administration in time range)    ED Course/ Medical Decision Making/ A&P                           Medical Decision Making Amount and/or Complexity of Data Reviewed Labs: ordered. Radiology: ordered.  Risk Prescription drug management.  This patient is a 41 y.o. female  who  presents to the ED for concern of chest tightness, SOB, cough x 1 week.   Differential diagnoses prior to evaluation: The emergent differential diagnosis includes, but is not limited to,  CHF, pericardial effusion/tamponade, arrhythmias, ACS, COPD, asthma, bronchitis, pneumonia, pneumothorax, PE, anemia, neuromuscular disorder. This is not an exhaustive differential.   Past Medical History / Co-morbidities: Sickle cell trait, anxiety, ADHD, hypertension  Additional history: Chart reviewed. Pertinent results include: Patient seen in ER on 8/13 complaining of abdominal pain and eczema.  Had reassuring work-up at that time and was discharged in stable condition.  Physical Exam: Physical exam performed. The pertinent findings include: Hypertensive, otherwise normal vital signs.  Normal oxygen saturation on room air.  Mild inspiratory and expiratory wheezing in all lung fields, without consolidation.  Good air movement to the bases.  Lab Tests/Imaging studies: I personally interpreted labs/imaging and the pertinent results include: Troponin of 10.3, however appears stable compared to prior.  Potassium 3.1.  Respiratory panel negative for COVID and flu.  Negative pregnancy.  Troponin of 3.  Chest x-ray without acute cardiopulmonary abnormalities.. I agree with the radiologist interpretation.  Cardiac monitoring: EKG obtained and interpreted by my attending physician which shows: Sinus rhythm   Medications: I ordered medication including albuterol inhaler.  I have reviewed the patients home medicines and have made adjustments as needed.    Disposition: After consideration of the diagnostic results and the patients response to treatment, I feel that emergency department workup does not suggest an emergent condition requiring admission or immediate intervention beyond what has been performed at this time. Reassuring cardiac workup. Heart score of 1. The plan is: Discharge to home with treatment of bronchitis versus mild asthma exacerbation in setting of viral illness.  Patient does have history of similar symptoms, but was never formally tested for asthma.  I recommended that she follow-up with the pulmonologist if her symptoms persist.  In the meantime we will treat cough with Tessalon Perles, and wheezing with albuterol inhaler.  The patient is safe for discharge and has been instructed to return immediately for worsening symptoms, change in symptoms or any other concerns.   Final Clinical Impression(s) / ED Diagnoses Final diagnoses:  Chest tightness  Wheezing  Acute cough    Rx / DC Orders ED Discharge Orders          Ordered    benzonatate (TESSALON) 100 MG capsule  Every 8 hours        05/12/22 0034    Ambulatory referral to Pulmonology        05/12/22 0034           Portions of this report may have been transcribed using voice recognition software. Every effort was made to ensure accuracy; however, inadvertent computerized transcription errors may be present.    Jeanella Flattery 05/12/22 0326    Glendora Score, MD 05/12/22 [phone]

## 2022-05-28 NOTE — Progress Notes (Deleted)
Synopsis: Referred for cough by Roemhildt, Lorin T, PA-C  Subjective:   PATIENT ID: Ebony Hansen GENDER: female DOB: 07-Oct-1980, MRN: [id number]  No chief complaint on file.  41yF with history of   Otherwise pertinent review of systems is negative.  Past Medical History:  Diagnosis Date   ADHD (attention deficit hyperactivity disorder)    Anxiety    Chlamydia    Hypertension    Sickle cell trait (HCC)      No family history on file.   Past Surgical History:  Procedure Laterality Date   CESAREAN SECTION     WISDOM TOOTH EXTRACTION      Social History   Socioeconomic History   Marital status: Married    Spouse name: Not on file   Number of children: Not on file   Years of education: Not on file   Highest education level: Not on file  Occupational History   Not on file  Tobacco Use   Smoking status: Never   Smokeless tobacco: Never  Vaping Use   Vaping Use: Some days  Substance and Sexual Activity   Alcohol use: Yes   Drug use: No   Sexual activity: Not on file  Other Topics Concern   Not on file  Social History Narrative   Not on file   Social Determinants of Health   Financial Resource Strain: Not on file  Food Insecurity: Not on file  Transportation Needs: Not on file  Physical Activity: Not on file  Stress: Not on file  Social Connections: Not on file  Intimate Partner Violence: Not on file     Allergies  Allergen Reactions   Shellfish Allergy Anaphylaxis, Swelling and Other (See Comments)    Caused drooling, also   Sulfa Drugs Cross Reactors Anaphylaxis, Swelling and Other (See Comments)    Caused drooling, also   Sulfasalazine Anaphylaxis, Swelling and Other (See Comments)    Caused drooling, also     Outpatient Medications Prior to Visit  Medication Sig Dispense Refill   ADDERALL XR 25 MG 24 hr capsule Take 25 mg by mouth every morning.      albuterol (VENTOLIN HFA) 108 (90 Base) MCG/ACT inhaler Inhale 2 puffs into the lungs every  6 (six) hours as needed for wheezing or shortness of breath.      benzonatate (TESSALON) 100 MG capsule Take 1 capsule (100 mg total) by mouth every 8 (eight) hours. 21 capsule 0   doxycycline (VIBRAMYCIN) 100 MG capsule Take 1 capsule (100 mg total) by mouth 2 (two) times daily. 20 capsule 0   famotidine (PEPCID) 20 MG tablet Take 1 tablet (20 mg total) by mouth daily.     ibuprofen (ADVIL) 200 MG tablet Take 800 mg by mouth every 6 (six) hours as needed for headache.     menthol-cetylpyridinium (CEPACOL) 3 MG lozenge Take 1 lozenge (3 mg total) by mouth as needed for sore throat. 100 tablet 12   Multiple Vitamins-Minerals (AIRBORNE GUMMIES) CHEW Chew 1-4 tablets by mouth 3 (three) times daily as needed (for immune support).     oxyCODONE (ROXICODONE) 5 MG immediate release tablet Take 1 tablet (5 mg total) by mouth every 6 (six) hours as needed for up to 20 doses for breakthrough pain. 20 tablet 0   pantoprazole (PROTONIX) 40 MG tablet Take 1 tablet (40 mg total) by mouth 2 (two) times daily before a meal. 30 tablet 0   predniSONE (DELTASONE) 10 MG tablet Take 5 on day 1  Take 4 on day 2  Take 3 on day 3 Take 2 on day 4 Take 1 on day 5 15 tablet 0   sucralfate (CARAFATE) 1 g tablet Take 1 tablet (1 g total) by mouth 4 (four) times daily -  with meals and at bedtime. 90 tablet 0   traMADol (ULTRAM) 50 MG tablet Take 50 mg by mouth 2 (two) times daily as needed (for pain).      No facility-administered medications prior to visit.       Objective:   Physical Exam:  General appearance: 41 y.o., female, NAD, conversant  Eyes: anicteric sclerae; PERRL, tracking appropriately HENT: NCAT; MMM Neck: Trachea midline; no lymphadenopathy, no JVD Lungs: CTAB, no crackles, no wheeze, with normal respiratory effort CV: RRR, no murmur  Abdomen: Soft, non-tender; non-distended, BS present  Extremities: No peripheral edema, warm Skin: Normal turgor and texture; no rash Psych: Appropriate  affect Neuro: Alert and oriented to person and place, no focal deficit     There were no vitals filed for this visit.   on *** LPM *** RA BMI Readings from Last 3 Encounters:  05/11/22 31.87 kg/m  05/02/22 31.18 kg/m  04/09/22 27.46 kg/m   Wt Readings from Last 3 Encounters:  05/11/22 179 lb 14.3 oz (81.6 kg)  05/02/22 176 lb (79.8 kg)  04/09/22 155 lb (70.3 kg)     CBC    Component Value Date/Time   WBC 9.3 05/11/2022 1941   RBC 4.13 05/11/2022 1941   HGB 10.3 (L) 05/11/2022 1941   HCT 32.5 (L) 05/11/2022 1941   PLT 311 05/11/2022 1941   MCV 78.7 (L) 05/11/2022 1941   MCH 24.9 (L) 05/11/2022 1941   MCHC 31.7 05/11/2022 1941   RDW 23.4 (H) 05/11/2022 1941   LYMPHSABS 1.1 11/12/2020 1148   MONOABS 0.3 11/12/2020 1148   EOSABS 0.1 11/12/2020 1148   BASOSABS 0.0 11/12/2020 1148    ***  Chest Imaging: CXR 05/11/22 reviewed by me unremarkable  CT A/P lung bases 05/02/22 reviewed by me unremarkable  Pulmonary Functions Testing Results:     No data to display          FeNO: ***  Pathology: ***  Echocardiogram: ***  Heart Catheterization: ***    Assessment & Plan:    Plan:      Omar Person, MD  Pulmonary Critical Care 05/28/2022 8:28 PM

## 2022-05-31 ENCOUNTER — Institutional Professional Consult (permissible substitution): Payer: Commercial Managed Care - HMO | Admitting: Student

## 2022-11-17 IMAGING — DX DG CHEST 1V PORT
1 series · 1 of 1 positions shown · non-contrast
Comparison: Chest radiograph July 26, 2019

CLINICAL DATA: Congestion since [REDACTED] now sore throat body aches
nausea cough and runny nose.

EXAM:
PORTABLE CHEST 1 VIEW

[chest ap]
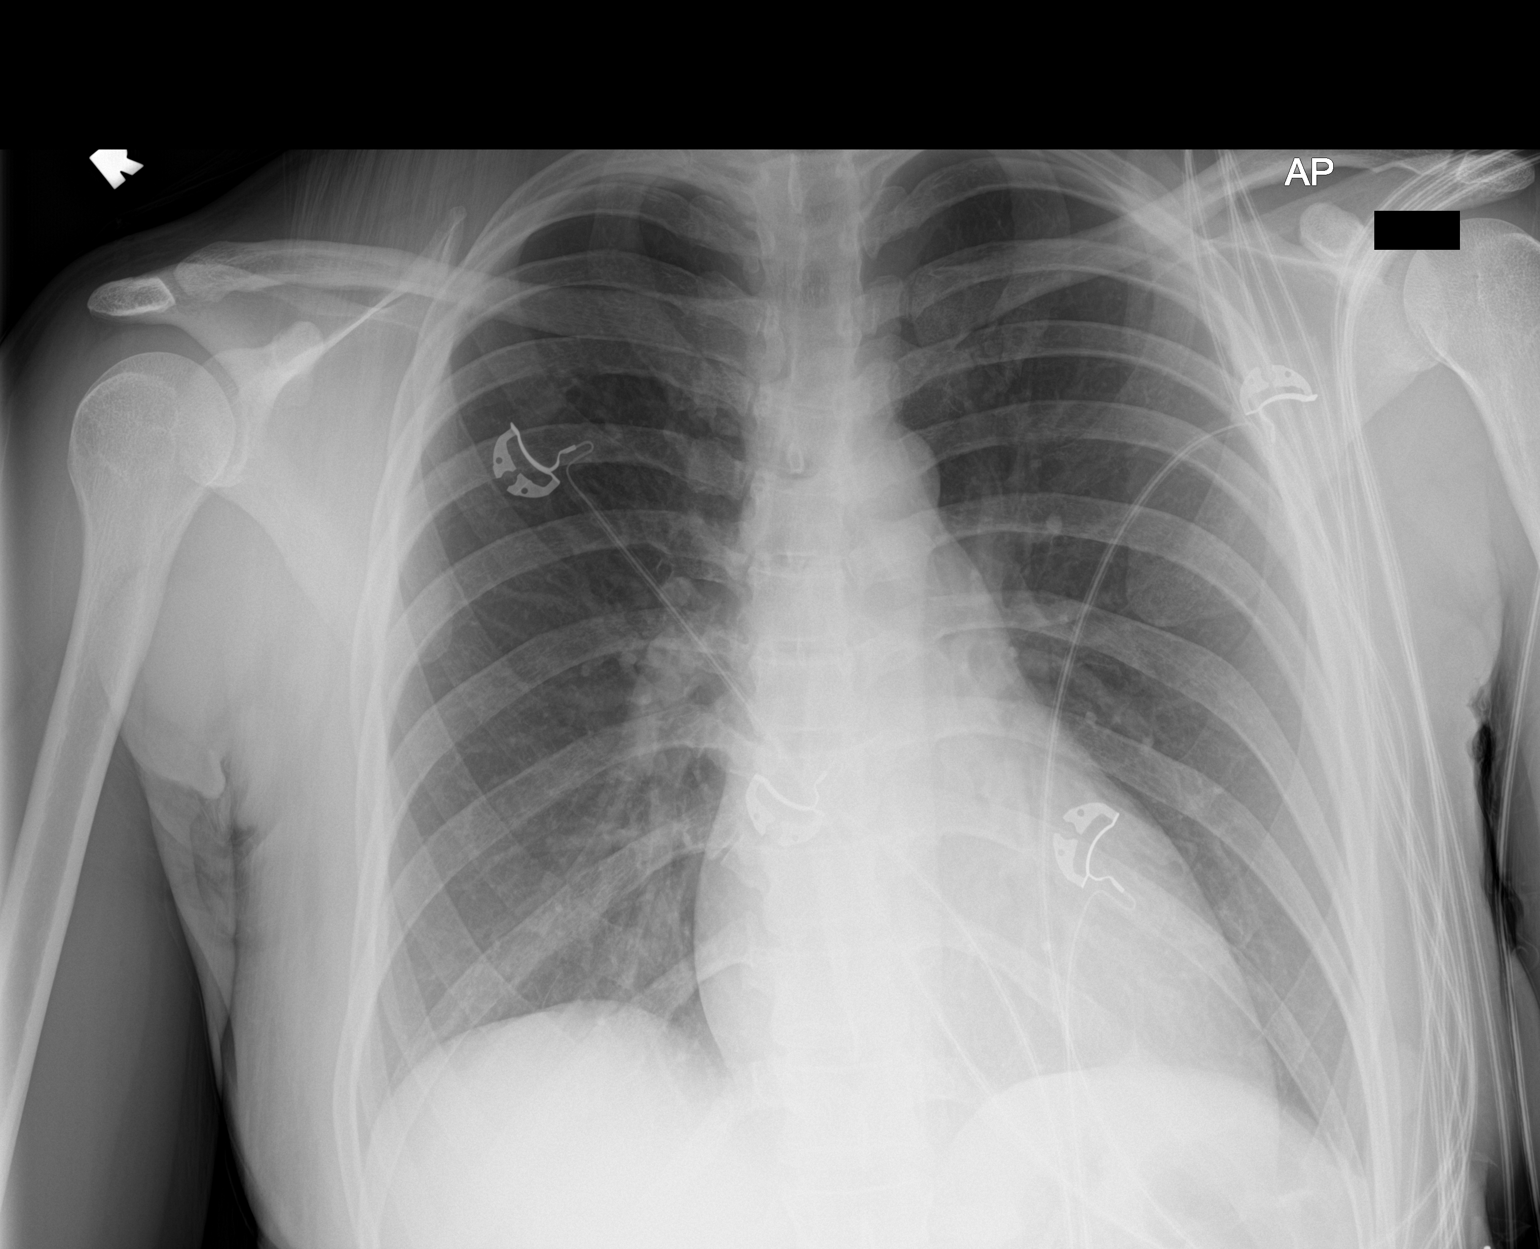

[1 of 1 positions shown; findings below may reference images not displayed]

FINDINGS: The heart size and mediastinal contours are within normal limits. No
focal consolidation. No pleural effusion. No pneumothorax. The
visualized skeletal structures are unremarkable.
IMPRESSION: No active disease.

## 2024-02-22 DIAGNOSIS — F1910 Other psychoactive substance abuse, uncomplicated: Secondary | ICD-10-CM | POA: Insufficient documentation

## 2024-02-22 DIAGNOSIS — F3112 Bipolar disorder, current episode manic without psychotic features, moderate: Secondary | ICD-10-CM | POA: Insufficient documentation

## 2024-02-22 DIAGNOSIS — N920 Excessive and frequent menstruation with regular cycle: Secondary | ICD-10-CM | POA: Insufficient documentation

## 2024-04-09 DIAGNOSIS — F319 Bipolar disorder, unspecified: Secondary | ICD-10-CM | POA: Diagnosis present

## 2024-04-11 DIAGNOSIS — F411 Generalized anxiety disorder: Secondary | ICD-10-CM | POA: Insufficient documentation

## 2024-04-11 DIAGNOSIS — L309 Dermatitis, unspecified: Secondary | ICD-10-CM | POA: Diagnosis present

## 2024-04-11 DIAGNOSIS — Z789 Other specified health status: Secondary | ICD-10-CM | POA: Insufficient documentation

## 2024-06-26 DIAGNOSIS — G588 Other specified mononeuropathies: Secondary | ICD-10-CM | POA: Insufficient documentation

## 2024-06-26 DIAGNOSIS — G589 Mononeuropathy, unspecified: Secondary | ICD-10-CM | POA: Insufficient documentation

## 2024-08-07 ENCOUNTER — Inpatient Hospital Stay (HOSPITAL_BASED_OUTPATIENT_CLINIC_OR_DEPARTMENT_OTHER)
Admission: EM | Admit: 2024-08-07 | Discharge: 2024-08-14 | DRG: 607 | Disposition: A | Discharge status: Home / Self Care | Attending: Internal Medicine | Admitting: Internal Medicine

## 2024-08-07 ENCOUNTER — Emergency Department (HOSPITAL_BASED_OUTPATIENT_CLINIC_OR_DEPARTMENT_OTHER)

## 2024-08-07 ENCOUNTER — Other Ambulatory Visit: Payer: Self-pay

## 2024-08-07 ENCOUNTER — Encounter (HOSPITAL_BASED_OUTPATIENT_CLINIC_OR_DEPARTMENT_OTHER): Payer: Self-pay

## 2024-08-07 DIAGNOSIS — Z882 Allergy status to sulfonamides status: Secondary | ICD-10-CM

## 2024-08-07 DIAGNOSIS — L280 Lichen simplex chronicus: Secondary | ICD-10-CM | POA: Diagnosis present

## 2024-08-07 DIAGNOSIS — I10 Essential (primary) hypertension: Secondary | ICD-10-CM | POA: Diagnosis present

## 2024-08-07 DIAGNOSIS — Z79899 Other long term (current) drug therapy: Secondary | ICD-10-CM

## 2024-08-07 DIAGNOSIS — F909 Attention-deficit hyperactivity disorder, unspecified type: Secondary | ICD-10-CM | POA: Diagnosis present

## 2024-08-07 DIAGNOSIS — R590 Localized enlarged lymph nodes: Secondary | ICD-10-CM | POA: Diagnosis present

## 2024-08-07 DIAGNOSIS — L0390 Cellulitis, unspecified: Secondary | ICD-10-CM

## 2024-08-07 DIAGNOSIS — Z888 Allergy status to other drugs, medicaments and biological substances status: Secondary | ICD-10-CM

## 2024-08-07 DIAGNOSIS — E872 Acidosis, unspecified: Secondary | ICD-10-CM | POA: Diagnosis present

## 2024-08-07 DIAGNOSIS — D573 Sickle-cell trait: Secondary | ICD-10-CM | POA: Diagnosis present

## 2024-08-07 DIAGNOSIS — F419 Anxiety disorder, unspecified: Secondary | ICD-10-CM | POA: Diagnosis present

## 2024-08-07 DIAGNOSIS — Z91013 Allergy to seafood: Secondary | ICD-10-CM

## 2024-08-07 DIAGNOSIS — L03818 Cellulitis of other sites: Secondary | ICD-10-CM | POA: Diagnosis present

## 2024-08-07 DIAGNOSIS — E669 Obesity, unspecified: Secondary | ICD-10-CM | POA: Diagnosis present

## 2024-08-07 DIAGNOSIS — F1220 Cannabis dependence, uncomplicated: Secondary | ICD-10-CM | POA: Diagnosis present

## 2024-08-07 DIAGNOSIS — G8929 Other chronic pain: Secondary | ICD-10-CM | POA: Diagnosis present

## 2024-08-07 DIAGNOSIS — A419 Sepsis, unspecified organism: Secondary | ICD-10-CM | POA: Diagnosis present

## 2024-08-07 DIAGNOSIS — F32A Depression, unspecified: Secondary | ICD-10-CM | POA: Diagnosis present

## 2024-08-07 DIAGNOSIS — L309 Dermatitis, unspecified: Principal | ICD-10-CM | POA: Diagnosis present

## 2024-08-07 DIAGNOSIS — N3000 Acute cystitis without hematuria: Secondary | ICD-10-CM

## 2024-08-07 DIAGNOSIS — Z6832 Body mass index (BMI) 32.0-32.9, adult: Secondary | ICD-10-CM

## 2024-08-07 DIAGNOSIS — F319 Bipolar disorder, unspecified: Secondary | ICD-10-CM | POA: Diagnosis present

## 2024-08-07 DIAGNOSIS — H109 Unspecified conjunctivitis: Secondary | ICD-10-CM | POA: Diagnosis present

## 2024-08-07 DIAGNOSIS — R591 Generalized enlarged lymph nodes: Secondary | ICD-10-CM | POA: Diagnosis present

## 2024-08-07 HISTORY — DX: Dermatitis, unspecified: L30.9

## 2024-08-07 HISTORY — DX: Bipolar disorder, unspecified: F31.9

## 2024-08-07 LAB — BASIC METABOLIC PANEL WITH GFR
Anion gap: 16 — ABNORMAL HIGH (ref 5–15)
BUN: 7 mg/dL (ref 6–20)
CO2: 21 mmol/L — ABNORMAL LOW (ref 22–32)
Calcium: 8.2 mg/dL — ABNORMAL LOW (ref 8.9–10.3)
Chloride: 105 mmol/L (ref 98–111)
Creatinine, Ser: 0.66 mg/dL (ref 0.44–1.00)
GFR, Estimated: >60 mL/min (ref >60)
Glucose, Bld: 93 mg/dL (ref 70–99)
Potassium: 4.1 mmol/L (ref 3.5–5.1)
Sodium: 142 mmol/L (ref 135–145)

## 2024-08-07 LAB — PRO BRAIN NATRIURETIC PEPTIDE: Pro Brain Natriuretic Peptide: <50 pg/mL (ref <300.0)

## 2024-08-07 LAB — HCG, SERUM, QUALITATIVE: Preg, Serum: NEGATIVE

## 2024-08-07 MED ORDER — CIPROFLOXACIN HCL 0.3 % OP SOLN: 1.0000 [drp] | OPHTHALMIC | 0 refills | Status: AC

## 2024-08-07 NOTE — ED Triage Notes (Addendum)
 Pt saw UC today after work dx with ear infection and abscesses under bilateral axilla Swelling to feet and hands Pt has eczema all over. +odor hands and feet are sloughing skin and they are very painful.  Pt in [facility] states she can not move r/t pain Also reports CP and SHOB

## 2024-08-08 ENCOUNTER — Encounter (HOSPITAL_COMMUNITY): Payer: Self-pay | Admitting: Family Medicine

## 2024-08-08 ENCOUNTER — Emergency Department (HOSPITAL_BASED_OUTPATIENT_CLINIC_OR_DEPARTMENT_OTHER)

## 2024-08-08 ENCOUNTER — Other Ambulatory Visit: Payer: Self-pay

## 2024-08-08 DIAGNOSIS — L03119 Cellulitis of unspecified part of limb: Secondary | ICD-10-CM | POA: Diagnosis not present

## 2024-08-08 DIAGNOSIS — A419 Sepsis, unspecified organism: Secondary | ICD-10-CM | POA: Diagnosis not present

## 2024-08-08 DIAGNOSIS — L309 Dermatitis, unspecified: Secondary | ICD-10-CM

## 2024-08-08 DIAGNOSIS — R591 Generalized enlarged lymph nodes: Secondary | ICD-10-CM | POA: Diagnosis present

## 2024-08-08 DIAGNOSIS — L301 Dyshidrosis [pompholyx]: Secondary | ICD-10-CM | POA: Insufficient documentation

## 2024-08-08 LAB — URINALYSIS, ROUTINE W REFLEX MICROSCOPIC
Bilirubin Urine: NEGATIVE
Glucose, UA: NEGATIVE mg/dL
Ketones, ur: 15 mg/dL — AB
Nitrite: NEGATIVE
Protein, ur: 30 mg/dL — AB
Specific Gravity, Urine: 1.015 (ref 1.005–1.030)
pH: 5.5 (ref 5.0–8.0)

## 2024-08-08 LAB — CBC
HCT: 29.8 % — ABNORMAL LOW (ref 36.0–46.0)
Hemoglobin: 9.5 g/dL — ABNORMAL LOW (ref 12.0–15.0)
MCH: 25.5 pg — ABNORMAL LOW (ref 26.0–34.0)
MCHC: 31.9 g/dL (ref 30.0–36.0)
MCV: 80.1 fL (ref 80.0–100.0)
Platelets: 435 K/uL — ABNORMAL HIGH (ref 150–400)
RBC: 3.72 MIL/uL — ABNORMAL LOW (ref 3.87–5.11)
RDW: 19.3 % — ABNORMAL HIGH (ref 11.5–15.5)
WBC: 18.9 K/uL — ABNORMAL HIGH (ref 4.0–10.5)
nRBC: 0 % (ref 0.0–0.2)

## 2024-08-08 LAB — CBC WITH DIFFERENTIAL/PLATELET
Abs Immature Granulocytes: 0.24 K/uL — ABNORMAL HIGH (ref 0.00–0.07)
Basophils Absolute: 0.1 K/uL (ref 0.0–0.1)
Basophils Relative: 0 %
Eosinophils Absolute: 0.2 K/uL (ref 0.0–0.5)
Eosinophils Relative: 1 %
HCT: 33.1 % — ABNORMAL LOW (ref 36.0–46.0)
Hemoglobin: 10.8 g/dL — ABNORMAL LOW (ref 12.0–15.0)
Immature Granulocytes: 1 %
Lymphocytes Relative: 14 %
Lymphs Abs: 3.9 K/uL (ref 0.7–4.0)
MCH: 24.7 pg — ABNORMAL LOW (ref 26.0–34.0)
MCHC: 32.6 g/dL (ref 30.0–36.0)
MCV: 75.6 fL — ABNORMAL LOW (ref 80.0–100.0)
Monocytes Absolute: 9.6 K/uL — ABNORMAL HIGH (ref 0.1–1.0)
Monocytes Relative: 34 %
Neutro Abs: 13.9 K/uL — ABNORMAL HIGH (ref 1.7–7.7)
Neutrophils Relative %: 50 %
Platelets: 528 K/uL — ABNORMAL HIGH (ref 150–400)
RBC: 4.38 MIL/uL (ref 3.87–5.11)
RDW: 19.1 % — ABNORMAL HIGH (ref 11.5–15.5)
WBC: 27.9 K/uL — ABNORMAL HIGH (ref 4.0–10.5)
nRBC: 0 % (ref 0.0–0.2)

## 2024-08-08 LAB — TROPONIN T, HIGH SENSITIVITY
Troponin T High Sensitivity: <15 ng/L (ref 0–19)
Troponin T High Sensitivity: <15 ng/L (ref 0–19)

## 2024-08-08 LAB — URINALYSIS, MICROSCOPIC (REFLEX): RBC / HPF: >50 RBC/hpf (ref 0–5)

## 2024-08-08 LAB — PATHOLOGIST SMEAR REVIEW

## 2024-08-08 LAB — SEDIMENTATION RATE: Sed Rate: 40 mm/h — ABNORMAL HIGH (ref 0–22)

## 2024-08-08 LAB — LACTIC ACID, PLASMA
Lactic Acid, Venous: 3.9 mmol/L (ref 0.5–1.9)
Lactic Acid, Venous: 1.2 mmol/L (ref 0.5–1.9)

## 2024-08-08 LAB — C-REACTIVE PROTEIN: CRP: 15.7 mg/dL — ABNORMAL HIGH (ref <1.0)

## 2024-08-08 MED ORDER — FENTANYL CITRATE (PF) 50 MCG/ML IJ SOSY
25.0000 ug | PREFILLED_SYRINGE | Freq: Once | INTRAMUSCULAR | Status: AC
  Administered 2024-08-08: 25 ug via INTRAVENOUS
  Filled 2024-08-08 (×2): qty 1

## 2024-08-08 MED ORDER — METRONIDAZOLE 500 MG/100ML IV SOLN
500.0000 mg | Freq: Once | INTRAVENOUS | Status: AC
  Administered 2024-08-08: 500 mg via INTRAVENOUS
  Filled 2024-08-08: qty 100

## 2024-08-08 MED ORDER — CITALOPRAM HYDROBROMIDE 20 MG PO TABS
20.0000 mg | ORAL_TABLET | Freq: Every day | ORAL | Status: DC
  Administered 2024-08-08 – 2024-08-14 (×7): 20 mg via ORAL
  Filled 2024-08-08 (×7): qty 1

## 2024-08-08 MED ORDER — ACETAMINOPHEN 325 MG PO TABS
650.0000 mg | ORAL_TABLET | Freq: Four times a day (QID) | ORAL | Status: DC | PRN
Start: 2024-08-08 — End: 2024-08-14
  Administered 2024-08-08: 650 mg via ORAL
  Filled 2024-08-08: qty 2

## 2024-08-08 MED ORDER — CIPROFLOXACIN HCL 0.3 % OP SOLN
2.0000 [drp] | OPHTHALMIC | Status: AC
  Administered 2024-08-08: 2 [drp] via OPHTHALMIC
  Filled 2024-08-08: qty 2.5

## 2024-08-08 MED ORDER — LACTATED RINGERS IV SOLN: INTRAVENOUS | Status: AC

## 2024-08-08 MED ORDER — OXYCODONE-ACETAMINOPHEN 5-325 MG PO TABS
1.0000 | ORAL_TABLET | Freq: Once | ORAL | Status: AC
  Administered 2024-08-08: 1 via ORAL
  Filled 2024-08-08: qty 1

## 2024-08-08 MED ORDER — SODIUM CHLORIDE 0.9 % IV SOLN
2.0000 g | Freq: Once | INTRAVENOUS | Status: AC
  Administered 2024-08-08: 2 g via INTRAVENOUS
  Filled 2024-08-08: qty 12.5

## 2024-08-08 MED ORDER — GABAPENTIN 300 MG PO CAPS
300.0000 mg | ORAL_CAPSULE | Freq: Three times a day (TID) | ORAL | Status: DC
  Administered 2024-08-08 – 2024-08-14 (×18): 300 mg via ORAL
  Filled 2024-08-08 (×18): qty 1

## 2024-08-08 MED ORDER — SODIUM CHLORIDE 0.9% FLUSH
3.0000 mL | Freq: Two times a day (BID) | INTRAVENOUS | Status: DC
  Administered 2024-08-08 – 2024-08-14 (×12): 3 mL via INTRAVENOUS

## 2024-08-08 MED ORDER — VANCOMYCIN HCL IN DEXTROSE 1-5 GM/200ML-% IV SOLN
1000.0000 mg | Freq: Once | INTRAVENOUS | Status: AC
  Administered 2024-08-08: 1000 mg via INTRAVENOUS
  Filled 2024-08-08: qty 200

## 2024-08-08 MED ORDER — HALOPERIDOL LACTATE 5 MG/ML IJ SOLN
2.0000 mg | Freq: Once | INTRAMUSCULAR | Status: AC
  Administered 2024-08-08: 2 mg via INTRAVENOUS
  Filled 2024-08-08: qty 1

## 2024-08-08 MED ORDER — QUETIAPINE FUMARATE 25 MG PO TABS
25.0000 mg | ORAL_TABLET | Freq: Every day | ORAL | Status: DC
  Administered 2024-08-08 – 2024-08-13 (×6): 25 mg via ORAL
  Filled 2024-08-08 (×6): qty 1

## 2024-08-08 MED ORDER — SODIUM CHLORIDE 0.9 % IV SOLN
2.0000 g | Freq: Three times a day (TID) | INTRAVENOUS | Status: DC
  Administered 2024-08-08 – 2024-08-10 (×6): 2 g via INTRAVENOUS
  Filled 2024-08-08 (×6): qty 12.5

## 2024-08-08 MED ORDER — BISACODYL 5 MG PO TBEC: 5.0000 mg | DELAYED_RELEASE_TABLET | Freq: Every day | ORAL | Status: DC | PRN

## 2024-08-08 MED ORDER — LACTATED RINGERS IV BOLUS
1000.0000 mL | Freq: Once | INTRAVENOUS | Status: AC
  Administered 2024-08-08: 1000 mL via INTRAVENOUS

## 2024-08-08 MED ORDER — OXYCODONE HCL 5 MG PO TABS
5.0000 mg | ORAL_TABLET | ORAL | Status: DC | PRN
  Administered 2024-08-08 – 2024-08-10 (×8): 5 mg via ORAL
  Filled 2024-08-08 (×8): qty 1

## 2024-08-08 MED ORDER — DIPHENHYDRAMINE HCL 25 MG PO CAPS
50.0000 mg | ORAL_CAPSULE | Freq: Four times a day (QID) | ORAL | Status: AC | PRN
  Administered 2024-08-08 – 2024-08-09 (×2): 50 mg via ORAL
  Filled 2024-08-08 (×2): qty 2

## 2024-08-08 MED ORDER — ONDANSETRON HCL 4 MG/2ML IJ SOLN: 4.0000 mg | Freq: Four times a day (QID) | INTRAMUSCULAR | Status: DC | PRN

## 2024-08-08 MED ORDER — PREDNISONE 50 MG PO TABS: 50.0000 mg | ORAL_TABLET | Freq: Every day | ORAL | Status: DC

## 2024-08-08 MED ORDER — BENZTROPINE MESYLATE 1 MG/ML IJ SOLN
1.0000 mg | Freq: Once | INTRAMUSCULAR | Status: AC
  Administered 2024-08-08: 1 mg via INTRAMUSCULAR
  Filled 2024-08-08: qty 2

## 2024-08-08 MED ORDER — SODIUM CHLORIDE 0.9 % IV SOLN: 2.0000 g | Freq: Two times a day (BID) | INTRAVENOUS | Status: DC

## 2024-08-08 MED ORDER — IOHEXOL 350 MG/ML SOLN
75.0000 mL | Freq: Once | INTRAVENOUS | Status: AC | PRN
  Administered 2024-08-08: 75 mL via INTRAVENOUS

## 2024-08-08 MED ORDER — ENOXAPARIN SODIUM 40 MG/0.4ML IJ SOSY
40.0000 mg | PREFILLED_SYRINGE | INTRAMUSCULAR | Status: DC
  Administered 2024-08-08 – 2024-08-09 (×2): 40 mg via SUBCUTANEOUS
  Filled 2024-08-08 (×2): qty 0.4

## 2024-08-08 MED ORDER — METHYLPREDNISOLONE SODIUM SUCC 125 MG IJ SOLR
125.0000 mg | Freq: Once | INTRAMUSCULAR | Status: AC
  Administered 2024-08-08: 125 mg via INTRAVENOUS
  Filled 2024-08-08: qty 2

## 2024-08-08 MED ORDER — ACETAMINOPHEN 650 MG RE SUPP: 650.0000 mg | Freq: Four times a day (QID) | RECTAL | Status: DC | PRN

## 2024-08-08 MED ORDER — ACETAMINOPHEN 325 MG PO TABS
650.0000 mg | ORAL_TABLET | Freq: Once | ORAL | Status: AC
  Administered 2024-08-08: 650 mg via ORAL
  Filled 2024-08-08: qty 2

## 2024-08-08 MED ORDER — ONDANSETRON HCL 4 MG PO TABS: 4.0000 mg | ORAL_TABLET | Freq: Four times a day (QID) | ORAL | Status: DC | PRN

## 2024-08-08 MED ORDER — VANCOMYCIN HCL 750 MG/150ML IV SOLN
750.0000 mg | Freq: Two times a day (BID) | INTRAVENOUS | Status: DC
  Administered 2024-08-08 – 2024-08-10 (×4): 750 mg via INTRAVENOUS
  Filled 2024-08-08 (×5): qty 150

## 2024-08-08 NOTE — ED Notes (Signed)
 Pt asked for food, will notify provider. Also informed her transport will be here shortly and they can get her food at the new facility.

## 2024-08-08 NOTE — ED Notes (Signed)
 Patient returned from radiology

## 2024-08-08 NOTE — Progress Notes (Signed)
 Pharmacy Antibiotic Note  Ebony Hansen is a 43 y.o. female who presented to the ED on 08/07/2024 with c/o CP and SOB.  Chest CT on 08/08/24 was negative for PE, but showed pathologic bilateral axillary adenopathy, possibly inflammatory or lymphoproliferative in nature.  Oncologist Dr. Autumn recom. to start abx for possible infection.  Pharmacy has been consulted to dose vancomycin.   Today, 08/08/2024: - pt received vancomycin 1000 mg at 0334, flagyl  500 mg at 0211 and cefepime 2gm at 0124 in the ED on 08/08/24 - scr 0.66   Plan: - vancomycin 750 mg IV q12h for est AUC 537 - adjust cefepime dose to 2gm q8h for crcl>60  ____________________________________________  Height: 5' 3 (160 cm) Weight: 83 kg (183 lb) IBW/kg (Calculated) : 52.4  Temp (24hrs), Avg:98.7 F (37.1 C), Min:98.2 F (36.8 C), Max:99.3 F (37.4 C)  Recent Labs  Lab 08/07/24 2322 08/08/24 0208 08/08/24 0439  WBC 27.9*  --   --   CREATININE 0.66  --   --   LATICACIDVEN  --  3.9* 1.2    Estimated Creatinine Clearance: 93.4 mL/min (by C-G formula based on SCr of 0.66 mg/dL).    Allergies  Allergen Reactions   Shellfish Allergy Anaphylaxis, Swelling and Other (See Comments)    Caused drooling, also   Sulfa Drugs Cross Reactors Anaphylaxis, Swelling and Other (See Comments)    Caused drooling, also   Sulfasalazine Anaphylaxis, Swelling and Other (See Comments)    Caused drooling, also    Thank you for allowing pharmacy to be a part of this patient's care.  Osie Iantha SQUIBB 08/08/2024 3:22 PM

## 2024-08-08 NOTE — Progress Notes (Signed)
 Plan of Care Note for accepted transfer   Patient: Ebony Hansen MRN: [id number]   DOA: 08/07/2024  Facility requesting transfer: [facility]   Requesting Provider: Dr. Nettie   Reason for transfer: Sepsis, lymphadenopathy  Facility course: 43 yr old female with depression, anxiety, chronic low back pain, and eczema who presents with chest pain, SOB, and bilateral axillary nodules.   She is afebrile and tachycardic. WBC is 27.9. Troponin and BNP are undetectable. CTA is negative for PE but notable for axillary lymphadenopathy that could be inflammatory or lymphoproliferative.   ED discussed with oncology (Dr. Ernie recommended treating for possible infection.   She was treated with vancomycin, cefepime, Flagyl , 1 liter LR, and analgesics.   Plan of care: The patient is accepted for admission to Progressive unit, at [facility].   Author: Evalene GORMAN Sprinkles, MD 08/08/2024  Check [email or web address] for on-call coverage.  Nursing staff, Please call [facility] Admits & Consults System-Wide number on Amion as soon as patient's arrival, so appropriate admitting provider can evaluate the pt.

## 2024-08-08 NOTE — H&P (Addendum)
 History and Physical    Patient: Ebony Hansen FMW:[id number] DOB: 1981/03/07 DOA: 08/07/2024 DOS: the patient was seen and examined on 08/08/2024 PCP: [facility]  Patient coming from: Home  Chief Complaint:  Chief Complaint  Patient presents with   Chest Pain   Edema   HPI: Ebony Hansen is a 43 y.o. female with medical history significant of eczema presented to the hospital because she was developing and what she thought was an eczema flareup that was the worst she has ever had.  Usually just her hands and feet are involved.  The day prior to arrival the rash basically affected her whole body including her eyes and around her mouth and her neck and her back and buttocks.  She went to work but was in a lot of discomfort and her coworkers convinced her to come to the emergency department for evaluation. She feels her skin cracking and weeping in the cracks.  She is not aware of any fever but she does have some chills and certain areas like her back for example feels very hot. She also feels extremely fatigued and has a lot of trouble getting out of bed in the morning.  This started approximately 4 days ago when she first noted that her hands were started to swell up.  She has not had any trouble with her mouth or her eyes or vagina or urinary area.  Those areas all feel normal.  She is on her period at this time.  She does not use tampons.   The patient has a diagnosis of eczema and about once a month she will have a flareup that requires her to go to the urgent care get a steroid shot.  She will use triamcinolone cream and Eucerin cream on her hands and feet and in a few days a goes away.  She has had flareups on multiple occasions.  She her primary care doctor has referred her to dermatology recently but initially she did not get a referral because everything would clear up in a couple days and she would be fine.  She has not heard back from the dermatology office  and does not have a scheduled appointment yet.  The patient's son has severe eczema and is on Dupixent.  Multiple of her family members also have trouble with eczema. In general the patient does not have trouble with joint pains.  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  She does fine when she is not in a flareup.  Her flareups usually begin with her sensing that her feet and hands are swelling.  That is usually the beginning indication.  Then she will get the extreme fatigue.  And then eventually the rash. In the emergency department the patient was evaluated and found to have an elevated white blood cell count of 27.  Her chest CT and UA did not show any signs of infection but she did have widespread pathologic lymphadenopathy. She had cracking and swelling of her hands and feet. She was treated with Vanc and cefepime for cellulitis and sepsis and ezcema flare. She did have an elevated lactic acid and elevated wbc.  She has pathologic lymphadenopathy which is the first time she is ever experience this.   Review of Systems: As mentioned in the history of present illness. All other systems reviewed and are negative. Past Medical History:  Diagnosis Date   ADHD (  attention deficit hyperactivity disorder)    Anxiety    Chlamydia    Eczema    Hypertension    Sickle cell trait    Past Surgical History:  Procedure Laterality Date   CESAREAN SECTION     WISDOM TOOTH EXTRACTION     Social History:  reports that she has never smoked. She has never used smokeless tobacco. She reports current alcohol use. She reports current drug use. Drug: Marijuana.  Allergies  Allergen Reactions   Shellfish Allergy Anaphylaxis, Swelling and Other (See Comments)    Caused drooling, also   Sulfa Drugs Cross Reactors Anaphylaxis, Swelling and Other (See Comments)    Caused drooling, also   Sulfasalazine Anaphylaxis, Swelling and Other (See Comments)     Caused drooling, also    History reviewed. No pertinent family history.  Prior to Admission medications   Medication Sig Start Date End Date Taking? Authorizing Provider  Ascorbic Acid (VITAMIN C PO) Take 1 tablet by mouth daily.   Yes [provider]  ciprofloxacin  (CILOXAN ) 0.3 % ophthalmic solution Place 1 drop into both eyes every 4 (four) hours while awake. Administer 1 drop, every 2 hours, while awake, for 2 days. Then 1 drop, every 4 hours, while awake, for the next 5 days. 08/07/24  Yes Palumbo, April, MD  citalopram  (CELEXA ) 20 MG tablet Take 20 mg by mouth daily. 01/06/22  Yes [provider]  cyclobenzaprine (FLEXERIL) 5 MG tablet Take 5 mg by mouth 3 (three) times daily as needed. 06/26/24  Yes [provider]  diclofenac (VOLTAREN) 75 MG EC tablet Take 75 mg by mouth 2 (two) times daily.   Yes [provider]  ferrous sulfate  325 (65 FE) MG tablet Take 325 mg by mouth every morning. Before breakfast 02/27/24  Yes [provider]  gabapentin  (NEURONTIN ) 300 MG capsule Take 300 mg by mouth 3 (three) times daily.   Yes [provider]  ibuprofen  (ADVIL ) 200 MG tablet Take 800 mg by mouth every 6 (six) hours as needed for headache.   Yes [provider]  QUEtiapine  (SEROQUEL ) 25 MG tablet Take 25 mg by mouth at bedtime.   Yes [provider]  triamcinolone  cream (KENALOG ) 0.1 % Apply 1 Application topically 2 (two) times daily. 07/18/24  Yes [provider]  triamcinolone  ointment (KENALOG ) 0.5 % Apply 1 Application topically 2 (two) times daily.   Yes [provider]  albuterol  (VENTOLIN  HFA) 108 (90 Base) MCG/ACT inhaler Inhale 2 puffs into the lungs every 6 (six) hours as needed for wheezing or shortness of breath.  Patient not taking: Reported on 08/08/2024    [provider]  benzonatate  (TESSALON ) 100 MG capsule Take 1 capsule (100 mg total) by mouth every 8 (eight) hours. Patient not  taking: Reported on 08/08/2024 05/12/22   Roemhildt, Lorin T, PA-C  ciprofloxacin -dexamethasone  (CIPRODEX ) OTIC suspension SMARTSIG:In Ear(s) Patient not taking: Reported on 08/08/2024 08/07/24   [provider]  doxycycline  (VIBRAMYCIN ) 100 MG capsule Take 1 capsule (100 mg total) by mouth 2 (two) times daily. Patient not taking: Reported on 08/08/2024 04/09/22   Ruthe Cornet, DO  famotidine  (PEPCID ) 20 MG tablet Take 1 tablet (20 mg total) by mouth daily. Patient not taking: Reported on 08/08/2024 07/27/19   Vann, Jessica U, DO  menthol -cetylpyridinium (CEPACOL) 3 MG lozenge Take 1 lozenge (3 mg total) by mouth as needed for sore throat. Patient not taking: Reported on 08/08/2024 07/27/19   Vann, Jessica U, DO  Multiple Vitamins-Minerals (  AIRBORNE GUMMIES) CHEW Chew 1-4 tablets by mouth 3 (three) times daily as needed (for immune support). Patient not taking: Reported on 08/08/2024    [provider]  oxyCODONE  (ROXICODONE ) 5 MG immediate release tablet Take 1 tablet (5 mg total) by mouth every 6 (six) hours as needed for up to 20 doses for breakthrough pain. Patient not taking: Reported on 08/08/2024 04/09/22   Ruthe Cornet, DO  pantoprazole  (PROTONIX ) 40 MG tablet Take 1 tablet (40 mg total) by mouth 2 (two) times daily before a meal. Patient not taking: Reported on 08/08/2024 05/02/22   Charlyn Sora, MD  predniSONE  (DELTASONE ) 10 MG tablet Take 5 on day 1  Take 4 on day 2  Take 3 on day 3 Take 2 on day 4 Take 1 on day 5 Patient not taking: Reported on 08/08/2024 07/27/19   Vann, Jessica U, DO  sucralfate  (CARAFATE ) 1 g tablet Take 1 tablet (1 g total) by mouth 4 (four) times daily -  with meals and at bedtime. Patient not taking: Reported on 08/08/2024 05/02/22   Charlyn Sora, MD  sulfamethoxazole-trimethoprim (BACTRIM DS) 800-160 MG tablet Take 1 tablet by mouth 2 (two) times daily. Patient not taking: Reported on 08/08/2024 08/07/24   [provider]   traMADol  (ULTRAM ) 50 MG tablet Take 50 mg by mouth 2 (two) times daily as needed (for pain).  Patient not taking: Reported on 08/08/2024 07/22/19   [provider]    Physical Exam: Vitals:   08/08/24 1100 08/08/24 1113 08/08/24 1200 08/08/24 1351  BP: 134/77  127/81 (!) 132/57  Pulse: 94  92 (!) 109  Resp: 18  12 18   Temp:  98.5 F (36.9 C)  99.3 F (37.4 C)  TempSrc:  Oral  Oral  SpO2: 100%  100% 100%  Weight:      Height:       Physical Exam:  General: No acute distress, well developed, well nourished HEENT: Normocephalic, atraumatic, PERRL Mucous membranes of eyes and mouth are moist and wnl. Cardiovascular: Normal rate and rhythm. Distal pulses intact. Pulmonary: Normal pulmonary effort, normal breath sounds Gastrointestinal: Nondistended abdomen, soft, non-tender, normoactive bowel sounds Musculoskeletal:Normal ROM, non pitting lower ext edema Lymphadenopathy: prominent cervical LAD and axillary LAD Skin: See pictures. Skin is hyperpigmented and thickened and oozy and cracked in multiple locations from neck to thorax to all extremities.  Feet and hands are wrapped. There is on odor of the feet and hands Neuro: No focal deficits noted, AAOx3. PSYCH: Attentive and cooperative  Data Reviewed:  Results for orders placed or performed during the hospital encounter of 08/07/24 (from the past 24 hours)  CBC with Differential     Status: Abnormal   Collection Time: 08/07/24 11:22 PM  Result Value Ref Range   WBC 27.9 (H) 4.0 - 10.5 K/uL   RBC 4.38 3.87 - 5.11 MIL/uL   Hemoglobin 10.8 (L) 12.0 - 15.0 g/dL   HCT 66.8 (L) 63.9 - 53.9 %   MCV 75.6 (L) 80.0 - 100.0 fL   MCH 24.7 (L) 26.0 - 34.0 pg   MCHC 32.6 30.0 - 36.0 g/dL   RDW 80.8 (H) 88.4 - 84.4 %   Platelets 528 (H) 150 - 400 K/uL   nRBC 0.0 0.0 - 0.2 %   Neutrophils Relative % 50 %   Neutro Abs 13.9 (H) 1.7 - 7.7 K/uL   Lymphocytes Relative 14 %   Lymphs Abs 3.9 0.7 - 4.0 K/uL   Monocytes Relative 34 %  Monocytes Absolute 9.6 (H) 0.1 - 1.0 K/uL   Eosinophils Relative 1 %   Eosinophils Absolute 0.2 0.0 - 0.5 K/uL   Basophils Relative 0 %   Basophils Absolute 0.1 0.0 - 0.1 K/uL   Smear Review See Note    Immature Granulocytes 1 %   Abs Immature Granulocytes 0.24 (H) 0.00 - 0.07 K/uL   Target Cells PRESENT   Basic metabolic panel     Status: Abnormal   Collection Time: 08/07/24 11:22 PM  Result Value Ref Range   Sodium 142 135 - 145 mmol/L   Potassium 4.1 3.5 - 5.1 mmol/L   Chloride 105 98 - 111 mmol/L   CO2 21 (L) 22 - 32 mmol/L   Glucose, Bld 93 70 - 99 mg/dL   BUN 7 6 - 20 mg/dL   Creatinine, Ser 9.33 0.44 - 1.00 mg/dL   Calcium  8.2 (L) 8.9 - 10.3 mg/dL   GFR, Estimated >39 >39 mL/min   Anion gap 16 (H) 5 - 15  hCG, serum, qualitative     Status: None   Collection Time: 08/07/24 11:22 PM  Result Value Ref Range   Preg, Serum NEGATIVE NEGATIVE  Pro Brain natriuretic peptide     Status: None   Collection Time: 08/07/24 11:22 PM  Result Value Ref Range   Pro Brain Natriuretic Peptide <50.0 <300.0 pg/mL  Troponin T, High Sensitivity     Status: None   Collection Time: 08/08/24  1:41 AM  Result Value Ref Range   Troponin T High Sensitivity <15 0 - 19 ng/L  Lactic acid, plasma     Status: Abnormal   Collection Time: 08/08/24  2:08 AM  Result Value Ref Range   Lactic Acid, Venous 3.9 (HH) 0.5 - 1.9 mmol/L  Urinalysis, Routine w reflex microscopic -Urine, Clean Catch     Status: Abnormal   Collection Time: 08/08/24  4:04 AM  Result Value Ref Range   Color, Urine YELLOW YELLOW   APPearance CLOUDY (A) CLEAR   Specific Gravity, Urine 1.015 1.005 - 1.030   pH 5.5 5.0 - 8.0   Glucose, UA NEGATIVE NEGATIVE mg/dL   Hgb urine dipstick LARGE (A) NEGATIVE   Bilirubin Urine NEGATIVE NEGATIVE   Ketones, ur 15 (A) NEGATIVE mg/dL   Protein, ur 30 (A) NEGATIVE mg/dL   Nitrite NEGATIVE NEGATIVE   Leukocytes,Ua TRACE (A) NEGATIVE  Urinalysis, Microscopic (reflex)     Status: Abnormal    Collection Time: 08/08/24  4:04 AM  Result Value Ref Range   RBC / HPF >50 0 - 5 RBC/hpf   WBC, UA 11-20 0 - 5 WBC/hpf   Bacteria, UA MANY (A) NONE SEEN   Squamous Epithelial / HPF 11-20 0 - 5 /HPF   Mucus PRESENT    Hyaline Casts, UA PRESENT   Lactic acid, plasma     Status: None   Collection Time: 08/08/24  4:39 AM  Result Value Ref Range   Lactic Acid, Venous 1.2 0.5 - 1.9 mmol/L    CTA Chest IMPRESSION: 1. No acute abnormality of the aorta 2. No pulmonary embolism 3. Pathologic bilateral axillary adenopathy, possibly inflammatory or lymphoproliferative in nature; recommend correlation with clinical history. If indicated, these would be easily amenable to ultrasound-guided tissue sampling.  Assessment and Plan: Diffuse severe skin rash with weeping/ pathologic lymphadenopathy/ wbc=27 - This rash involves greater than 50% of her body.  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 No mucositis or joint pain. Vitals stable - She needs a skin biopsy and ideally a dermatology consult - Will send ESR, CRP, RPR, ANA - Consider IV steroids - consider biopsy of lyphadenopathy  2.  Her admission diagnosis was sepsis.  There may be some infection and in the weeping areas of her feet but no other sign of infection.  Her UA and CT of chest did not reveal infection.  She is currently on vancomycin  and cefepime  which she will continue for now.   Advance Care Planning:   Code Status: Full Code the patient names her mother is her surrogate decision maker and she wants to be full code.  Consults: General surgery to do skin biopsy  Family Communication: None  Severity of Illness: The appropriate patient status for this patient is INPATIENT. Inpatient status is judged to be reasonable and necessary in order to provide the required intensity of service to ensure the patient's  safety. The patient's presenting symptoms, physical exam findings, and initial radiographic and laboratory data in the context of their chronic comorbidities is felt to place them at high risk for further clinical deterioration. Furthermore, it is not anticipated that the patient will be medically stable for discharge from the hospital within 2 midnights of admission.   * I certify that at the point of admission it is my clinical judgment that the patient will require inpatient hospital care spanning beyond 2 midnights from the point of admission due to high intensity of service, high risk for further deterioration and high frequency of surveillance required.*  Author: ARTHEA CHILD, MD 08/08/2024 3:24 PM  For on call review [email or web address].

## 2024-08-08 NOTE — ED Provider Notes (Signed)
 [place] [facility] Provider Note   CSN: [id number] Arrival date & time: 08/07/24  2233     Patient presents with: Chest Pain and Edema   Ebony Hansen is a 43 y.o. female.   The history is provided by the patient.  Illness Location:  Skin Quality:  Lichenification with some oozing and swelling Severity:  Moderate Onset quality:  Gradual Timing:  Constant Progression:  Worsening Chronicity:  Chronic Context:  Has known eczema Relieved by:  Nothing Worsened by:  Nothing Ineffective treatments:  Triamcinolone Associated symptoms: myalgias, rash and shortness of breath   Associated symptoms: no diarrhea, no fever, no headaches and no rhinorrhea   Patient also has chest pain and SOB and all over body pain.  States UC diagnosed abscesses in the axilla and ear infection.  Has not started antibiotics as of yet.       Prior to Admission medications   Medication Sig Start Date End Date Taking? Authorizing Provider  ciprofloxacin  (CILOXAN ) 0.3 % ophthalmic solution Place 1 drop into both eyes every 4 (four) hours while awake. Administer 1 drop, every 2 hours, while awake, for 2 days. Then 1 drop, every 4 hours, while awake, for the next 5 days. 08/07/24  Yes Manasseh Pittsley, MD  ADDERALL XR 25 MG 24 hr capsule Take 25 mg by mouth every morning.  07/05/19   [provider]  albuterol  (VENTOLIN  HFA) 108 (90 Base) MCG/ACT inhaler Inhale 2 puffs into the lungs every 6 (six) hours as needed for wheezing or shortness of breath.     [provider]  benzonatate  (TESSALON ) 100 MG capsule Take 1 capsule (100 mg total) by mouth every 8 (eight) hours. 05/12/22   Roemhildt, Lorin T, PA-C  doxycycline  (VIBRAMYCIN ) 100 MG capsule Take 1 capsule (100 mg total) by mouth 2 (two) times daily. 04/09/22   Curatolo, Adam, DO  famotidine  (PEPCID ) 20 MG tablet Take 1 tablet (20 mg total) by mouth daily. 07/27/19   Vann, Jessica U, DO  ibuprofen  (ADVIL )  200 MG tablet Take 800 mg by mouth every 6 (six) hours as needed for headache.    [provider]  menthol -cetylpyridinium (CEPACOL) 3 MG lozenge Take 1 lozenge (3 mg total) by mouth as needed for sore throat. 07/27/19   Vann, Jessica U, DO  Multiple Vitamins-Minerals (AIRBORNE GUMMIES) CHEW Chew 1-4 tablets by mouth 3 (three) times daily as needed (for immune support).    [provider]  oxyCODONE  (ROXICODONE ) 5 MG immediate release tablet Take 1 tablet (5 mg total) by mouth every 6 (six) hours as needed for up to 20 doses for breakthrough pain. 04/09/22   Curatolo, Adam, DO  pantoprazole  (PROTONIX ) 40 MG tablet Take 1 tablet (40 mg total) by mouth 2 (two) times daily before a meal. 05/02/22   Charlyn Sora, MD  predniSONE  (DELTASONE ) 10 MG tablet Take 5 on day 1  Take 4 on day 2  Take 3 on day 3 Take 2 on day 4 Take 1 on day 5 07/27/19   Vann, Jessica U, DO  sucralfate  (CARAFATE ) 1 g tablet Take 1 tablet (1 g total) by mouth 4 (four) times daily -  with meals and at bedtime. 05/02/22   Charlyn Sora, MD  traMADol  (ULTRAM ) 50 MG tablet Take 50 mg by mouth 2 (two) times daily as needed (for pain).  07/22/19   [provider]    Allergies: Shellfish allergy, Sulfa drugs cross reactors, and Sulfasalazine  Review of Systems  Constitutional:  Negative for fever.  HENT:  Negative for rhinorrhea.   Eyes:  Positive for discharge.  Respiratory:  Positive for shortness of breath.   Cardiovascular:  Positive for leg swelling.  Gastrointestinal:  Negative for diarrhea.  Musculoskeletal:  Positive for myalgias.  Skin:  Positive for rash.  Neurological:  Negative for headaches.  All other systems reviewed and are negative.   Updated Vital Signs BP 120/63   Pulse (!) 118   Temp 98.6 F (37 C)   Resp 15   Ht 5' 3 (1.6 m)   Wt 83 kg   LMP 08/06/2024 (Exact Date)   SpO2 100%   BMI 32.42 kg/m   Physical Exam Vitals and nursing note reviewed.  Constitutional:       Appearance: Normal appearance.  HENT:     Head: Normocephalic and atraumatic.  Eyes:     Pupils: Pupils are equal, round, and reactive to light.     Comments: Copious B purulent discharge from the eyes   Cardiovascular:     Rate and Rhythm: Regular rhythm. Tachycardia present.     Pulses: Normal pulses.     Heart sounds: Normal heart sounds.  Pulmonary:     Effort: No respiratory distress.     Breath sounds: No wheezing or rales.  Abdominal:     General: Bowel sounds are normal.     Tenderness: There is no abdominal tenderness.  Skin:    General: Skin is warm and dry.     Capillary Refill: Capillary refill takes less than 2 seconds.     Comments: Lichenification and oozing,  Neurological:     General: No focal deficit present.     Mental Status: She is alert and oriented to person, place, and time.     Deep Tendon Reflexes: Reflexes normal.     (all labs ordered are listed, but only abnormal results are displayed) Results for orders placed or performed during the hospital encounter of 08/07/24  Troponin T, High Sensitivity   Collection Time: 08/07/24 12:46 AM  Result Value Ref Range   Troponin T High Sensitivity <15 0 - 19 ng/L  CBC with Differential   Collection Time: 08/07/24 11:22 PM  Result Value Ref Range   WBC 27.9 (H) 4.0 - 10.5 K/uL   RBC 4.38 3.87 - 5.11 MIL/uL   Hemoglobin 10.8 (L) 12.0 - 15.0 g/dL   HCT 66.8 (L) 63.9 - 53.9 %   MCV 75.6 (L) 80.0 - 100.0 fL   MCH 24.7 (L) 26.0 - 34.0 pg   MCHC 32.6 30.0 - 36.0 g/dL   RDW 80.8 (H) 88.4 - 84.4 %   Platelets 528 (H) 150 - 400 K/uL   nRBC 0.0 0.0 - 0.2 %   Neutrophils Relative % 50 %   Neutro Abs 13.9 (H) 1.7 - 7.7 K/uL   Lymphocytes Relative 14 %   Lymphs Abs 3.9 0.7 - 4.0 K/uL   Monocytes Relative 34 %   Monocytes Absolute 9.6 (H) 0.1 - 1.0 K/uL   Eosinophils Relative 1 %   Eosinophils Absolute 0.2 0.0 - 0.5 K/uL   Basophils Relative 0 %   Basophils Absolute 0.1 0.0 - 0.1 K/uL   Smear Review See Note     Immature Granulocytes 1 %   Abs Immature Granulocytes 0.24 (H) 0.00 - 0.07 K/uL   Target Cells PRESENT   Basic metabolic panel   Collection Time: 08/07/24 11:22 PM  Result Value Ref Range  Sodium 142 135 - 145 mmol/L   Potassium 4.1 3.5 - 5.1 mmol/L   Chloride 105 98 - 111 mmol/L   CO2 21 (L) 22 - 32 mmol/L   Glucose, Bld 93 70 - 99 mg/dL   BUN 7 6 - 20 mg/dL   Creatinine, Ser 9.33 0.44 - 1.00 mg/dL   Calcium  8.2 (L) 8.9 - 10.3 mg/dL   GFR, Estimated >39 >39 mL/min   Anion gap 16 (H) 5 - 15  hCG, serum, qualitative   Collection Time: 08/07/24 11:22 PM  Result Value Ref Range   Preg, Serum NEGATIVE NEGATIVE  Pro Brain natriuretic peptide   Collection Time: 08/07/24 11:22 PM  Result Value Ref Range   Pro Brain Natriuretic Peptide <50.0 <300.0 pg/mL  Troponin T, High Sensitivity   Collection Time: 08/08/24  1:41 AM  Result Value Ref Range   Troponin T High Sensitivity <15 0 - 19 ng/L  Lactic acid, plasma   Collection Time: 08/08/24  2:08 AM  Result Value Ref Range   Lactic Acid, Venous 3.9 (HH) 0.5 - 1.9 mmol/L   CT Angio Chest PE W and/or Wo Contrast Result Date: 08/08/2024 EXAM: CTA CHEST AORTA 08/08/2024 12:21:39 AM TECHNIQUE: CTA of the chest was performed after the administration of 75 mL of iohexol  (OMNIPAQUE ) 350 MG/ML injection. Multiplanar reformatted images are provided for review. MIP images are provided for review. Automated exposure control, iterative reconstruction, and/or weight based adjustment of the mA/kV was utilized to reduce the radiation dose to as low as reasonably achievable. COMPARISON: None available. CLINICAL HISTORY: Syncope/presyncope, cerebrovascular cause suspected. FINDINGS: AORTA: No significant atherosclerotic calcification within the thoracic aorta. No thoracic aortic dissection. No aortic aneurysm. MEDIASTINUM: No significant coronary artery calcification. Global cardiac size within normal limits. No pericardial effusion. Central pulmonary  arteries are of normal caliber. Esophagus unremarkable. Visualized thyroid is unremarkable. No additional pathologic thoracic adenopathy. LYMPH NODES: There is pathologic bilateral axillary adenopathy present with multiple bulky lymph nodes measuring up to 2.5 cm in short axis diameter. These may be inflammatory, as could be seen with underlying conditions such as sarcoidosis, or lymphoproliferative in nature is to be seen with lymphoma or leukemia. No mediastinal or hilar lymphadenopathy. LUNGS AND PLEURA: The lungs are without acute process. No focal consolidation or pulmonary edema. No pleural effusion or pneumothorax. No pulmonary embolism. UPPER ABDOMEN: Limited images of the upper abdomen are unremarkable. SOFT TISSUES AND BONES: No acute bone or soft tissue abnormality. IMPRESSION: 1. No acute abnormality of the aorta 2. No pulmonary embolism 3. Pathologic bilateral axillary adenopathy, possibly inflammatory or lymphoproliferative in nature; recommend correlation with clinical history. If indicated, these would be easily amenable to ultrasound-guided tissue sampling. Electronically signed by: Dorethia Molt MD 08/08/2024 12:31 AM EST RP Workstation: [id number]   DG Chest Portable 1 View Result Date: 08/07/2024 CLINICAL DATA:  Recently diagnosed with an ear infection and abscesses under the bilateral axilla, presenting with chest pain and shortness of breath. EXAM: PORTABLE CHEST 1 VIEW COMPARISON:  May 11, 2022 FINDINGS: The heart size and mediastinal contours are within normal limits. Both lungs are clear. The visualized skeletal structures are unremarkable. IMPRESSION: No active disease. Electronically Signed   By: Suzen Dials M.D.   On: 08/07/2024 23:24    EKG:  Date: 08/08/2024  Rate: 116  Rhythm: sinus tachycardia   QRS Axis: normal  Intervals: normal  ST/T Wave abnormalities: normal  Conduction Disutrbances: none  Narrative Interpretation:sinus tachycardia      Radiology: CT  Angio  Chest PE W and/or Wo Contrast Result Date: 08/08/2024 EXAM: CTA CHEST AORTA 08/08/2024 12:21:39 AM TECHNIQUE: CTA of the chest was performed after the administration of 75 mL of iohexol  (OMNIPAQUE ) 350 MG/ML injection. Multiplanar reformatted images are provided for review. MIP images are provided for review. Automated exposure control, iterative reconstruction, and/or weight based adjustment of the mA/kV was utilized to reduce the radiation dose to as low as reasonably achievable. COMPARISON: None available. CLINICAL HISTORY: Syncope/presyncope, cerebrovascular cause suspected. FINDINGS: AORTA: No significant atherosclerotic calcification within the thoracic aorta. No thoracic aortic dissection. No aortic aneurysm. MEDIASTINUM: No significant coronary artery calcification. Global cardiac size within normal limits. No pericardial effusion. Central pulmonary arteries are of normal caliber. Esophagus unremarkable. Visualized thyroid is unremarkable. No additional pathologic thoracic adenopathy. LYMPH NODES: There is pathologic bilateral axillary adenopathy present with multiple bulky lymph nodes measuring up to 2.5 cm in short axis diameter. These may be inflammatory, as could be seen with underlying conditions such as sarcoidosis, or lymphoproliferative in nature is to be seen with lymphoma or leukemia. No mediastinal or hilar lymphadenopathy. LUNGS AND PLEURA: The lungs are without acute process. No focal consolidation or pulmonary edema. No pleural effusion or pneumothorax. No pulmonary embolism. UPPER ABDOMEN: Limited images of the upper abdomen are unremarkable. SOFT TISSUES AND BONES: No acute bone or soft tissue abnormality. IMPRESSION: 1. No acute abnormality of the aorta 2. No pulmonary embolism 3. Pathologic bilateral axillary adenopathy, possibly inflammatory or lymphoproliferative in nature; recommend correlation with clinical history. If indicated, these would be easily amenable to  ultrasound-guided tissue sampling. Electronically signed by: Dorethia Molt MD 08/08/2024 12:31 AM EST RP Workstation: [id number]   DG Chest Portable 1 View Result Date: 08/07/2024 CLINICAL DATA:  Recently diagnosed with an ear infection and abscesses under the bilateral axilla, presenting with chest pain and shortness of breath. EXAM: PORTABLE CHEST 1 VIEW COMPARISON:  May 11, 2022 FINDINGS: The heart size and mediastinal contours are within normal limits. Both lungs are clear. The visualized skeletal structures are unremarkable. IMPRESSION: No active disease. Electronically Signed   By: Suzen Dials M.D.   On: 08/07/2024 23:24     .Critical Care  Performed by: Nettie Earing, MD Authorized by: Nettie Earing, MD   Critical care provider statement:    Critical care time (minutes):  60   Critical care end time:  08/08/2024 3:22 AM   Critical care was necessary to treat or prevent imminent or life-threatening deterioration of the following conditions:  Sepsis   Critical care was time spent personally by me on the following activities:  Development of treatment plan with patient or surrogate, discussions with consultants, evaluation of patient's response to treatment, examination of patient, ordering and review of laboratory studies, ordering and review of radiographic studies, ordering and performing treatments and interventions, pulse oximetry, re-evaluation of patient's condition and review of old charts   I assumed direction of critical care for this patient from another provider in my specialty: no     Care discussed with: admitting provider      Medications Ordered in the ED  lactated ringers  infusion (has no administration in time range)  ceFEPIme  (MAXIPIME ) 2 g in sodium chloride  0.9 % 100 mL IVPB (has no administration in time range)  metroNIDAZOLE  (FLAGYL ) IVPB 500 mg (has no administration in time range)  vancomycin  (VANCOCIN ) IVPB 1000 mg/200 mL premix (has no administration  in time range)  lactated ringers  bolus 1,000 mL (has no administration in time range)  ciprofloxacin  (CILOXAN ) 0.3 %  ophthalmic solution 2 drop (has no administration in time range)  iohexol  (OMNIPAQUE ) 350 MG/ML injection 75 mL (75 mLs Intravenous Contrast Given 08/08/24 0013)                                    Medical Decision Making Eczema not worked up by pmd in process of being referred to dermatology   Amount and/or Complexity of Data Reviewed Independent Historian: spouse    Details: See above  External Data Reviewed: labs and notes.    Details: Previous notes reviewed baseline white count is mid 6, hemoglobin is stable  Labs: ordered.    Details: White count markedly elevated 27.9, hemoglobin low 10. 8, elevated platelets 528 (new) pregnancy is negative.  Troponin is negative < 15.  Normal sodium 142, normal potassium 4.1 elevated lactate 3.9 Radiology: ordered and independent interpretation performed.    Details: Negative CTA for PE LAN by me  ECG/medicine tests: ordered and independent interpretation performed. Decision-making details documented in ED Course. Discussion of management or test interpretation with external provider(s): 1:02 AM Case d/w oncology.  Treat sepsis and then LAN can be worked up   Risk OTC drugs. Prescription drug management. Decision regarding hospitalization. Risk Details: Sepsis with concern sarcoid or underlying rheumatologic or malignancy.  Will admit for treatment of infection.  Will need close follow up    Final diagnoses:  Eczema, unspecified type  Conjunctivitis of both eyes, unspecified conjunctivitis type  Cellulitis, unspecified cellulitis site  Lymphadenopathy    ED Discharge Orders          Ordered    ciprofloxacin  (CILOXAN ) 0.3 % ophthalmic solution  Every 4 hours while awake        08/07/24 2327               Raad Clayson, MD 08/08/24 [phone]

## 2024-08-08 NOTE — ED Notes (Signed)
 Bilateral hands wrapped in Xeroform gauze and bulky dressing for comfort.

## 2024-08-08 NOTE — Significant Event (Addendum)
 Hospitalist cross cover  I did call [facility] to consider transfer.  [facility] has no medical beds available.  I was able to speak with a dermatologist on-call.  After reviewing the case, especially the part about the patient having frequent flareups that require urgent care visits and prednisone , she feels this is less likely to be TEN. Even with the significant lymphadenopathy it is possible that this is a major eczema flare. - She recommends either wrapping the whole body in gauze dipped in triamcinolone ointment or IV steroids. - Will start IV steroids - Her office will call the patient and get her a urgent scheduled appointment within the next week. - General Surgery here has agreed to do a skin biopsy.

## 2024-08-09 DIAGNOSIS — L309 Dermatitis, unspecified: Secondary | ICD-10-CM | POA: Diagnosis not present

## 2024-08-09 LAB — BASIC METABOLIC PANEL WITH GFR
Anion gap: 11 (ref 5–15)
BUN: <5 mg/dL — ABNORMAL LOW (ref 6–20)
CO2: 24 mmol/L (ref 22–32)
Calcium: 8.4 mg/dL — ABNORMAL LOW (ref 8.9–10.3)
Chloride: 108 mmol/L (ref 98–111)
Creatinine, Ser: 0.67 mg/dL (ref 0.44–1.00)
GFR, Estimated: >60 mL/min (ref >60)
Glucose, Bld: 127 mg/dL — ABNORMAL HIGH (ref 70–99)
Potassium: 4.3 mmol/L (ref 3.5–5.1)
Sodium: 142 mmol/L (ref 135–145)

## 2024-08-09 LAB — HIV ANTIBODY (ROUTINE TESTING W REFLEX): HIV Screen 4th Generation wRfx: NONREACTIVE

## 2024-08-09 LAB — GC/CHLAMYDIA PROBE AMP (~~LOC~~) NOT AT ARMC
Chlamydia: NEGATIVE
Comment: NEGATIVE
Comment: NORMAL
Neisseria Gonorrhea: NEGATIVE

## 2024-08-09 LAB — CBC
HCT: 30.7 % — ABNORMAL LOW (ref 36.0–46.0)
Hemoglobin: 9.6 g/dL — ABNORMAL LOW (ref 12.0–15.0)
MCH: 24.7 pg — ABNORMAL LOW (ref 26.0–34.0)
MCHC: 31.3 g/dL (ref 30.0–36.0)
MCV: 79.1 fL — ABNORMAL LOW (ref 80.0–100.0)
Platelets: 475 K/uL — ABNORMAL HIGH (ref 150–400)
RBC: 3.88 MIL/uL (ref 3.87–5.11)
RDW: 19.3 % — ABNORMAL HIGH (ref 11.5–15.5)
WBC: 17.3 K/uL — ABNORMAL HIGH (ref 4.0–10.5)
nRBC: 0 % (ref 0.0–0.2)

## 2024-08-09 LAB — RHEUMATOID FACTOR: Rheumatoid fact SerPl-aCnc: 12.8 [IU]/mL (ref <14.0)

## 2024-08-09 LAB — ANA W/REFLEX IF POSITIVE: Anti Nuclear Antibody (ANA): NEGATIVE

## 2024-08-09 LAB — RPR: RPR Ser Ql: NONREACTIVE

## 2024-08-09 MED ORDER — FERROUS SULFATE 325 (65 FE) MG PO TABS
325.0000 mg | ORAL_TABLET | Freq: Every morning | ORAL | Status: DC
  Administered 2024-08-10 – 2024-08-14 (×5): 325 mg via ORAL
  Filled 2024-08-09 (×5): qty 1

## 2024-08-09 MED ORDER — METHYLPREDNISOLONE SODIUM SUCC 40 MG IJ SOLR
40.0000 mg | INTRAMUSCULAR | Status: DC
  Administered 2024-08-09 – 2024-08-14 (×6): 40 mg via INTRAVENOUS
  Filled 2024-08-09 (×6): qty 1

## 2024-08-09 MED ORDER — TRIAMCINOLONE ACETONIDE 0.1 % EX OINT
TOPICAL_OINTMENT | Freq: Two times a day (BID) | CUTANEOUS | Status: DC
  Filled 2024-08-09: qty 240
  Filled 2024-08-09 (×3): qty 80
  Filled 2024-08-09: qty 15

## 2024-08-09 MED ORDER — DIPHENHYDRAMINE HCL 25 MG PO CAPS
50.0000 mg | ORAL_CAPSULE | Freq: Four times a day (QID) | ORAL | Status: DC | PRN
  Administered 2024-08-09 – 2024-08-10 (×5): 50 mg via ORAL
  Filled 2024-08-09 (×6): qty 2

## 2024-08-09 MED ORDER — DIPHENHYDRAMINE HCL 50 MG/ML IJ SOLN: 50.0000 mg | Freq: Four times a day (QID) | INTRAMUSCULAR | Status: DC | PRN

## 2024-08-09 NOTE — Progress Notes (Signed)
 PROGRESS NOTE    Ebony Hansen  FMW:[id number] DOB: 17-Jul-1981 DOA: 08/07/2024 PCP: [facility]    Brief Narrative:   Ebony Hansen is a 43 y.o. female with past medical history significant for eczema, depression, anxiety, chronic low back pain who presented to [facility] ED on 08/07/2024 with complaints of progressive rash, sloughing of skin involving a significant portion of her body.  Patient reports similar flareups in the past due to her eczema but nothing as significant as this time.  Patient reports rash affecting her whole body including her eyes, around her mouth, neck, back, buttocks, legs/feet.  Patient feels that her skin is cracking and weeping from the sites; as well as significant edema.  She also reports chills but denies any fevers.  Due to significant discomfort her coworkers convinced her to come to the ED for further evaluation.  Patient reports that she has a diagnosis of eczema and about once a month she will have a flareup that requires her to go to urgent care to get a steroid shot and she will utilize triamcinolone cream and Eucerin cream on her hands and feet with resolution/improvement of her symptoms in a few days.  Her primary care physician has referred her to dermatology recently but has not had official visit as of yet.  Patient's son as well as other family members also has severe eczema and currently maintained on Dupixent.  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  Her flareups typically begin with her feet/hands swelling.  In the ED, temperature 96.6 F, HR 123, RR 20, BP 123/84, SpO2 100% on room air.  WBC 27.9, hemoglobin 10.8, platelet count 528.  Sodium 142, potassium 4.1, chloride 105, CO2 21, glucose 93, BUN 7, creatinine 0.66.  BNP less than 50.0.  High sensitive troponin less than 15 x 2.  hCG negative.   Lactic acid 3.9.  Chest x-ray with no active cardiopulmonary disease process.  CT angiogram chest with no acute abnormality of the aorta, no pulmonary embolism, pathologic bilateral axillary adenopathy possibly inflammatory versus lymphoproliferative in nature.  Blood cultures x 2 drawn.  Patient was started on vancomycin, cefepime, Flagyl , received 1 L LR and analgesics.  EDP discussed with oncology, Dr. Autumn who recommended treating for possible infection.  TRH was consulted and patient was transferred to [facility] for further evaluation and management of severe eczema flare.  Assessment & Plan:   Severe eczema flare Toxic epidermal necrolysis, ruled out (no mucosal involvement) Cellulitis, diffuse Patient presenting to ED with progressive diffuse skin rash associated with weeping, pathologic lymphadenopathy noted on imaging.  WBC count elevated 27.9.  Patient has history of eczema with usual flares once monthly usually treated with IM Rocephin injection and topical steroids.  This occasion has been much worse, has been referred to dermatology outpatient but has yet to receive a visit.  Has known history of eczema as well as significant family history as well requiring therapy with Dupixent.  Patient denies mucositis or joint pain.  Admitting physician discussed with dermatologist on-call at [facility] after reviewing the case this is likely a major eczema flare and less likely TENS as there is no mucosal involvement; even with significant lymphadenopathy.  Dermatologist recommends IV steroids and topical triamcinolone. -- WBC 27.9>18.9>17.3 -- ESR  40 -- CRP 15.7 -- Lactic acid  3.9>1.2 -- Solu-Medrol 40 mg IV every 24 hours -- Triamcinolone ointment to affected areas twice daily -- Vancomycin, pharmacy consulted for dosing/monitoring -- Cefepime 2 g IV every 8 hours -- Oxycodone  5 g p.o. every 4 hours as needed moderate pain -- CBC, ESR, CRP daily  Anxiety/depression: --  Celexa 20 mg p.o. daily   DVT prophylaxis: enoxaparin  (LOVENOX ) injection 40 mg Start: 08/08/24 1700    Code Status: Full Code Family Communication: No family present at bedside this morning  Disposition Plan:  Level of care: Progressive Status is: Inpatient Remains inpatient appropriate because: IV antibiotics, IV steroids    Consultants:  Admitting physician discussed with dermatologist on-call at [facility]  Procedures:  None  Antimicrobials:  Vancomycin 11/18>> Cefepime 11/18>> Metronidazole  11/18 - 11/18   Subjective: Patient seen examined bedside, lying in bed.  Reports swelling around her eyes and hands improved.  Continues with significant rash and pain to her feet.  WBC count improving.  Discussed will continue IV antibiotics, IV steroids for now and start topical triamcinolone.  No other questions or concerns at this time.  Denies any issues swallowing, no mucosal involvement of rash.  Further denies headache, no dizziness, no chest pain, no palpitations, no shortness of breath, no abdominal pain, no fever/chills/night sweats, no nausea/vomiting/diarrhea, no focal weakness, no fatigue, no paresthesia.  No acute events overnight per nursing staff.  Objective: Vitals:   08/08/24 1200 08/08/24 1351 08/08/24 2018 08/09/24 0416  BP: 127/81 (!) 132/57 (!) 142/67 (!) 144/70  Pulse: 92 (!) 109 99 84  Resp: 12 18 16 20   Temp:  99.3 F (37.4 C) 98.3 F (36.8 C) 98.6 F (37 C)  TempSrc:  Oral Oral Oral  SpO2: 100% 100% 99% 100%  Weight:      Height:        Intake/Output Summary (Last 24 hours) at 08/09/2024 1225 Last data filed at 08/09/2024 0600 Gross per 24 hour  Intake 1250 ml  Output --  Net 1250 ml   Filed Weights   08/07/24 2259  Weight: 83 kg    Examination:  Physical Exam: GEN: NAD, alert and oriented x 3, obese HEENT: NCAT, PERRL, EOMI, sclera clear, MMM PULM: CTAB w/o wheezes/crackles, normal respiratory effort, on room  air CV: RRR w/o M/G/R GI: abd soft, NTND, + BS MSK: + peripheral edema, moves all extremities independently NEURO: No focal neurological deficit PSYCH: normal mood/affect Integumentary: Skin hyperpigmented, thickened with multiple cracks/fissures, edematous with the [place] of multiple sites as noted in pictures below.                     Data Reviewed: I have personally reviewed following labs and imaging studies  CBC: Recent Labs  Lab 08/07/24 2322 08/08/24 1705 08/09/24 0427  WBC 27.9* 18.9* 17.3*  NEUTROABS 13.9*  --   --   HGB 10.8* 9.5* 9.6*  HCT 33.1* 29.8* 30.7*  MCV 75.6* 80.1 79.1*  PLT 528* 435* 475*   Basic Metabolic Panel: Recent Labs  Lab 08/07/24 2322 08/09/24 0427  NA 142 142  K 4.1 4.3  CL 105 108  CO2 21* 24  GLUCOSE 93 127*  BUN 7 <5*  CREATININE 0.66 0.67  CALCIUM  8.2* 8.4*   GFR: Estimated Creatinine Clearance: 93.4 mL/min (by C-G formula based on SCr of 0.67 mg/dL). Liver Function Tests: No results for input(s): AST, ALT, ALKPHOS, BILITOT, PROT, ALBUMIN in the last 168 hours. No results for input(s): LIPASE, AMYLASE  in the last 168 hours. No results for input(s): AMMONIA in the last 168 hours. Coagulation Profile: No results for input(s): INR, PROTIME in the last 168 hours. Cardiac Enzymes: No results for input(s): CKTOTAL, CKMB, CKMBINDEX, TROPONINI in the last 168 hours. BNP (last 3 results) Recent Labs    08/07/24 2322  PROBNP <50.0   HbA1C: No results for input(s): HGBA1C in the last 72 hours. CBG: No results for input(s): GLUCAP in the last 168 hours. Lipid Profile: No results for input(s): CHOL, HDL, LDLCALC, TRIG, CHOLHDL, LDLDIRECT in the last 72 hours. Thyroid Function Tests: No results for input(s): TSH, T4TOTAL, FREET4, T3FREE, THYROIDAB in the last 72 hours. Anemia Panel: No results for input(s): VITAMINB12, FOLATE, FERRITIN, TIBC,  IRON, RETICCTPCT in the last 72 hours. Sepsis Labs: Recent Labs  Lab 08/08/24 0208 08/08/24 0439  LATICACIDVEN 3.9* 1.2    Recent Results (from the past 240 hours)  Culture, blood (single)     Status: None (Preliminary result)   Collection Time: 08/08/24  1:34 AM   Specimen: BLOOD LEFT FOREARM  Result Value Ref Range Status   Specimen Description   Final    BLOOD LEFT FOREARM Performed at [facility], [address] [place], [place] [address]    Special Requests   Final    BOTTLES DRAWN AEROBIC AND ANAEROBIC Blood Culture adequate volume Performed at [facility], [address] [place], [place] [address]    Culture   Final    NO GROWTH < 24 HOURS Performed at [facility], [address], [place], [place] [address]    Report Status PENDING  Incomplete         Radiology Studies: CT Angio Chest PE W and/or Wo Contrast Result Date: 08/08/2024 EXAM: CTA CHEST AORTA 08/08/2024 12:21:39 AM TECHNIQUE: CTA of the chest was performed after the administration of 75 mL of iohexol  (OMNIPAQUE ) 350 MG/ML injection. Multiplanar reformatted images are provided for review. MIP images are provided for review. Automated exposure control, iterative reconstruction, and/or weight based adjustment of the mA/kV was utilized to reduce the radiation dose to as low as reasonably achievable. COMPARISON: None available. CLINICAL HISTORY: Syncope/presyncope, cerebrovascular cause suspected. FINDINGS: AORTA: No significant atherosclerotic calcification within the thoracic aorta. No thoracic aortic dissection. No aortic aneurysm. MEDIASTINUM: No significant coronary artery calcification. Global cardiac size within normal limits. No pericardial effusion. Central pulmonary arteries are of normal caliber. Esophagus unremarkable. Visualized thyroid is unremarkable. No additional pathologic thoracic adenopathy. LYMPH NODES: There is pathologic bilateral axillary adenopathy present  with multiple bulky lymph nodes measuring up to 2.5 cm in short axis diameter. These may be inflammatory, as could be seen with underlying conditions such as sarcoidosis, or lymphoproliferative in nature is to be seen with lymphoma or leukemia. No mediastinal or hilar lymphadenopathy. LUNGS AND PLEURA: The lungs are without acute process. No focal consolidation or pulmonary edema. No pleural effusion or pneumothorax. No pulmonary embolism. UPPER ABDOMEN: Limited images of the upper abdomen are unremarkable. SOFT TISSUES AND BONES: No acute bone or soft tissue abnormality. IMPRESSION: 1. No acute abnormality of the aorta 2. No pulmonary embolism 3. Pathologic bilateral axillary adenopathy, possibly inflammatory or lymphoproliferative in nature; recommend correlation with clinical history. If indicated, these would be easily amenable to ultrasound-guided tissue sampling. Electronically signed by: Dorethia Molt MD 08/08/2024 12:31 AM EST RP Workstation: [id number]   DG Chest Portable 1 View Result Date: 08/07/2024 CLINICAL DATA:  Recently diagnosed with an ear infection  and abscesses under the bilateral axilla, presenting with chest pain and shortness of breath. EXAM: PORTABLE CHEST 1 VIEW COMPARISON:  May 11, 2022 FINDINGS: The heart size and mediastinal contours are within normal limits. Both lungs are clear. The visualized skeletal structures are unremarkable. IMPRESSION: No active disease. Electronically Signed   By: Suzen Dials M.D.   On: 08/07/2024 23:24        Scheduled Meds:  citalopram  20 mg Oral Daily   enoxaparin  (LOVENOX ) injection  40 mg Subcutaneous Q24H   gabapentin  300 mg Oral TID   methylPREDNISolone (SOLU-MEDROL) injection  40 mg Intravenous Q24H   QUEtiapine  25 mg Oral QHS   sodium chloride  flush  3 mL Intravenous Q12H   triamcinolone ointment   Topical BID   Continuous Infusions:  ceFEPime (MAXIPIME) IV 2 g (08/09/24 0926)   vancomycin 750 mg (08/09/24 0324)      LOS: 1 day    Time spent: 52 minutes spent on 08/09/2024 caring for this patient face-to-face including chart review, ordering labs/tests, documenting, discussion with nursing staff, consultants, updating family and interview/physical exam    Camellia PARAS Itzael Liptak, DO [facility] Available via Epic secure chat 7am-7pm After these hours, please refer to coverage provider listed on [email or web address] 08/09/2024, 12:25 PM
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
## 2024-08-10 DIAGNOSIS — L309 Dermatitis, unspecified: Secondary | ICD-10-CM | POA: Diagnosis not present

## 2024-08-10 LAB — CBC
HCT: 25.6 % — ABNORMAL LOW (ref 36.0–46.0)
Hemoglobin: 7.9 g/dL — ABNORMAL LOW (ref 12.0–15.0)
MCH: 24.3 pg — ABNORMAL LOW (ref 26.0–34.0)
MCHC: 30.9 g/dL (ref 30.0–36.0)
MCV: 78.8 fL — ABNORMAL LOW (ref 80.0–100.0)
Platelets: 421 K/uL — ABNORMAL HIGH (ref 150–400)
RBC: 3.25 MIL/uL — ABNORMAL LOW (ref 3.87–5.11)
RDW: 19.1 % — ABNORMAL HIGH (ref 11.5–15.5)
WBC: 27.2 K/uL — ABNORMAL HIGH (ref 4.0–10.5)
nRBC: 0 % (ref 0.0–0.2)

## 2024-08-10 LAB — C-REACTIVE PROTEIN: CRP: 5.3 mg/dL — ABNORMAL HIGH (ref <1.0)

## 2024-08-10 LAB — SEDIMENTATION RATE: Sed Rate: 30 mm/h — ABNORMAL HIGH (ref 0–22)

## 2024-08-10 MED ORDER — OXYCODONE HCL 5 MG PO TABS
5.0000 mg | ORAL_TABLET | ORAL | Status: DC | PRN
  Administered 2024-08-10 (×2): 10 mg via ORAL
  Administered 2024-08-11: 5 mg via ORAL
  Administered 2024-08-11 – 2024-08-12 (×4): 10 mg via ORAL
  Filled 2024-08-10 (×7): qty 2
  Filled 2024-08-10: qty 1

## 2024-08-10 MED ORDER — CEFAZOLIN SODIUM-DEXTROSE 2-4 GM/100ML-% IV SOLN
2.0000 g | Freq: Three times a day (TID) | INTRAVENOUS | Status: DC
  Administered 2024-08-10 – 2024-08-14 (×12): 2 g via INTRAVENOUS
  Filled 2024-08-10 (×12): qty 100

## 2024-08-10 MED ORDER — HYDROMORPHONE HCL 1 MG/ML IJ SOLN
0.5000 mg | INTRAMUSCULAR | Status: DC | PRN
  Administered 2024-08-10 – 2024-08-14 (×20): 0.5 mg via INTRAVENOUS
  Filled 2024-08-10 (×20): qty 0.5

## 2024-08-10 MED ORDER — AQUAPHOR EX OINT
TOPICAL_OINTMENT | Freq: Two times a day (BID) | CUTANEOUS | Status: DC | PRN
Start: 2024-08-10 — End: 2024-08-14
  Filled 2024-08-10 (×4): qty 50

## 2024-08-10 NOTE — Consult Note (Signed)
 Patient with history of eczema, has full and partial thickness skin loss to B hands red moist.  Patient uses Aquaphor and vaseline at home.    Will order Aquaphor to hands 2 times daily.  WOC team will not follow. Re-consult if further needs arise.   Thank you,    Powell Bar MSN, RN-BC, [employer]

## 2024-08-10 NOTE — Progress Notes (Signed)
 PROGRESS NOTE    Ebony Hansen  FMW:[id number] DOB: 20-Sep-1981 DOA: 08/07/2024 PCP: [facility]    Brief Narrative:   Ebony Hansen is a 43 y.o. female with past medical history significant for eczema, depression, anxiety, chronic low back pain who presented to [facility] ED on 08/07/2024 with complaints of progressive rash, sloughing of skin involving a significant portion of her body.  Patient reports similar flareups in the past due to her eczema but nothing as significant as this time.  Patient reports rash affecting her whole body including her eyes, around her mouth, neck, back, buttocks, legs/feet.  Patient feels that her skin is cracking and weeping from the sites; as well as significant edema.  She also reports chills but denies any fevers.  Due to significant discomfort her coworkers convinced her to come to the ED for further evaluation.  Patient reports that she has a diagnosis of eczema and about once a month she will have a flareup that requires her to go to urgent care to get a steroid shot and she will utilize triamcinolone  cream and Eucerin cream on her hands and feet with resolution/improvement of her symptoms in a few days.  Her primary care physician has referred her to dermatology recently but has not had official visit as of yet.  Patient's son as well as other family members also has severe eczema and currently maintained on Dupixent.  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  Her flareups typically begin with her feet/hands swelling.  In the ED, temperature 96.6 F, HR 123, RR 20, BP 123/84, SpO2 100% on room air.  WBC 27.9, hemoglobin 10.8, platelet count 528.  Sodium 142, potassium 4.1, chloride 105, CO2 21, glucose 93, BUN 7, creatinine 0.66.  BNP less than 50.0.  High sensitive troponin less than 15 x 2.  hCG negative.   Lactic acid 3.9.  Chest x-ray with no active cardiopulmonary disease process.  CT angiogram chest with no acute abnormality of the aorta, no pulmonary embolism, pathologic bilateral axillary adenopathy possibly inflammatory versus lymphoproliferative in nature.  Blood cultures x 2 drawn.  Patient was started on vancomycin , cefepime , Flagyl , received 1 L LR and analgesics.  EDP discussed with oncology, Dr. Autumn who recommended treating for possible infection.  TRH was consulted and patient was transferred to [facility] for further evaluation and management of severe eczema flare.  Assessment & Plan:   Severe eczema flare Toxic epidermal necrolysis, ruled out (no mucosal involvement) Cellulitis, diffuse Patient presenting to ED with progressive diffuse skin rash associated with weeping, pathologic lymphadenopathy noted on imaging.  WBC count elevated 27.9.  Patient has history of eczema with usual flares once monthly usually treated with IM Rocephin injection and topical steroids.  This occasion has been much worse, has been referred to dermatology outpatient but has yet to receive a visit.  Has known history of eczema as well as significant family history as well requiring therapy with Dupixent.  Patient denies mucositis or joint pain.  Admitting physician discussed with dermatologist on-call at [facility] after reviewing the case this is likely a major eczema flare and less likely TENS as there is no mucosal involvement; even with significant lymphadenopathy.  Dermatologist recommends IV steroids and topical triamcinolone . -- WBC 27.9>18.9>17.3>27.2 (likely steroid effect) -- ESR  40>30 -- CRP 15.7 --  Lactic acid 3.9>1.2 -- Solu-Medrol  40 mg IV every 24 hours -- Triamcinolone  ointment to affected areas twice daily -- Cefazolin  2 g IV every 8 hours -- Oxycodone  5 g p.o. every 4 hours as needed moderate pain -- CBC, ESR, CRP daily  Anxiety/depression: -- Celexa  20 mg p.o.  daily   DVT prophylaxis:     Code Status: Full Code Family Communication: No family present at bedside this morning  Disposition Plan:  Level of care: Med-Surg Status is: Inpatient Remains inpatient appropriate because: IV antibiotics, IV steroids    Consultants:  Admitting physician discussed with dermatologist on-call at [facility]  Procedures:  None  Antimicrobials:  Vancomycin  11/18 - 11/20 Cefepime  11/18 - 11/21 Metronidazole  11/18 - 11/18 Cefazolin  11/21>>   Subjective: Patient seen examined bedside, lying in bed.  Eating breakfast.  Continues to endorse improvement of swelling to hands and face, still has swelling to lower extremities/feet making ambulation difficult.  Overall feels much improved since initial presentation.  Remains on IV steroids.  Changing antibiotics to cefazolin  for coverage.  No other questions or concerns at this time.  Denies any issues swallowing, no mucosal involvement of rash.  Further denies headache, no dizziness, no chest pain, no palpitations, no shortness of breath, no abdominal pain, no fever/chills/night sweats, no nausea/vomiting/diarrhea, no focal weakness, no fatigue, no paresthesia.  No acute events overnight per nursing staff.  Objective: Vitals:   08/08/24 2018 08/09/24 0416 08/09/24 2023 08/10/24 0438  BP: (!) 142/67 (!) 144/70 (!) 148/80 130/78  Pulse: 99 84 94 90  Resp: 16 20 16 20   Temp: 98.3 F (36.8 C) 98.6 F (37 C) 98.2 F (36.8 C) 98.2 F (36.8 C)  TempSrc: Oral Oral Oral Oral  SpO2: 99% 100% 98% 98%  Weight:      Height:        Intake/Output Summary (Last 24 hours) at 08/10/2024 1057 Last data filed at 08/10/2024 9057 Gross per 24 hour  Intake 720 ml  Output --  Net 720 ml   Filed Weights   08/07/24 2259  Weight: 83 kg    Examination:  Physical Exam: GEN: NAD, alert and oriented x 3, obese HEENT: NCAT, PERRL, EOMI, sclera clear, MMM PULM: CTAB w/o wheezes/crackles, normal  respiratory effort, on room air CV: RRR w/o M/G/R GI: abd soft, NTND, + BS MSK: + peripheral edema, moves all extremities independently NEURO: No focal neurological deficit PSYCH: normal mood/affect Integumentary: Skin hyperpigmented, thickened with multiple cracks/fissures, edematous with the [place] of multiple sites as noted in pictures below.                     Data Reviewed: I have personally reviewed following labs and imaging studies  CBC: Recent Labs  Lab 08/07/24 2322 08/08/24 1705 08/09/24 0427 08/10/24 0349  WBC 27.9* 18.9* 17.3* 27.2*  NEUTROABS 13.9*  --   --   --   HGB 10.8* 9.5* 9.6* 7.9*  HCT 33.1* 29.8* 30.7* 25.6*  MCV 75.6* 80.1 79.1* 78.8*  PLT 528* 435* 475* 421*   Basic Metabolic Panel: Recent Labs  Lab 08/07/24 2322 08/09/24 0427  NA 142 142  K 4.1 4.3  CL 105 108  CO2 21* 24  GLUCOSE 93 127*  BUN 7 <5*  CREATININE 0.66 0.67  CALCIUM  8.2* 8.4*   GFR: Estimated Creatinine Clearance: 93.4 mL/min (by C-G formula based on SCr of 0.67 mg/dL). Liver Function Tests: No results for input(s): AST, ALT, ALKPHOS, BILITOT,  PROT, ALBUMIN in the last 168 hours. No results for input(s): LIPASE, AMYLASE in the last 168 hours. No results for input(s): AMMONIA in the last 168 hours. Coagulation Profile: No results for input(s): INR, PROTIME in the last 168 hours. Cardiac Enzymes: No results for input(s): CKTOTAL, CKMB, CKMBINDEX, TROPONINI in the last 168 hours. BNP (last 3 results) Recent Labs    08/07/24 2322  PROBNP <50.0   HbA1C: No results for input(s): HGBA1C in the last 72 hours. CBG: No results for input(s): GLUCAP in the last 168 hours. Lipid Profile: No results for input(s): CHOL, HDL, LDLCALC, TRIG, CHOLHDL, LDLDIRECT in the last 72 hours. Thyroid Function Tests: No results for input(s): TSH, T4TOTAL, FREET4, T3FREE, THYROIDAB in the last 72 hours. Anemia  Panel: No results for input(s): VITAMINB12, FOLATE, FERRITIN, TIBC, IRON, RETICCTPCT in the last 72 hours. Sepsis Labs: Recent Labs  Lab 08/08/24 0208 08/08/24 0439  LATICACIDVEN 3.9* 1.2    Recent Results (from the past 240 hours)  Culture, blood (single)     Status: None (Preliminary result)   Collection Time: 08/08/24  1:34 AM   Specimen: BLOOD LEFT FOREARM  Result Value Ref Range Status   Specimen Description   Final    BLOOD LEFT FOREARM Performed at [facility], [address] [place], [place] [address]    Special Requests   Final    BOTTLES DRAWN AEROBIC AND ANAEROBIC Blood Culture adequate volume Performed at [facility], [address] [place], [place] [address]    Culture   Final    NO GROWTH 2 DAYS Performed at [facility], [address] [place], [place] [address]    Report Status PENDING  Incomplete         Radiology Studies: No results found.       Scheduled Meds:  citalopram   20 mg Oral Daily   ferrous sulfate   325 mg Oral q morning   gabapentin   300 mg Oral TID   methylPREDNISolone  (SOLU-MEDROL ) injection  40 mg Intravenous Q24H   QUEtiapine   25 mg Oral QHS   sodium chloride  flush  3 mL Intravenous Q12H   triamcinolone  ointment   Topical BID   Continuous Infusions:   ceFAZolin  (ANCEF ) IV       LOS: 2 days    Time spent: 42 minutes spent on 08/10/2024 caring for this patient face-to-face including chart review, ordering labs/tests, documenting, discussion with nursing staff, consultants, updating family and interview/physical exam    Ebony PARAS Bernarda Erck, DO [facility] Available via Epic secure chat 7am-7pm After these hours, please refer to coverage provider listed on [email or web address] 08/10/2024, 10:57 AM

## 2024-08-11 DIAGNOSIS — L309 Dermatitis, unspecified: Secondary | ICD-10-CM | POA: Diagnosis not present

## 2024-08-11 LAB — CBC
HCT: 26.5 % — ABNORMAL LOW (ref 36.0–46.0)
Hemoglobin: 8.4 g/dL — ABNORMAL LOW (ref 12.0–15.0)
MCH: 25.1 pg — ABNORMAL LOW (ref 26.0–34.0)
MCHC: 31.7 g/dL (ref 30.0–36.0)
MCV: 79.1 fL — ABNORMAL LOW (ref 80.0–100.0)
Platelets: 432 K/uL — ABNORMAL HIGH (ref 150–400)
RBC: 3.35 MIL/uL — ABNORMAL LOW (ref 3.87–5.11)
RDW: 19.1 % — ABNORMAL HIGH (ref 11.5–15.5)
WBC: 25.6 K/uL — ABNORMAL HIGH (ref 4.0–10.5)
nRBC: 0.2 % (ref 0.0–0.2)

## 2024-08-11 LAB — SEDIMENTATION RATE: Sed Rate: 24 mm/h — ABNORMAL HIGH (ref 0–22)

## 2024-08-11 LAB — C-REACTIVE PROTEIN: CRP: 1.9 mg/dL — ABNORMAL HIGH (ref <1.0)

## 2024-08-11 NOTE — Plan of Care (Signed)

## 2024-08-11 NOTE — Progress Notes (Signed)
 PROGRESS NOTE    [name]  FMW:[id number] DOB: 1980-11-11 DOA: 08/07/2024 PCP: [facility]    Brief Narrative:   Ebony Hansen is a 43 y.o. female with past medical history significant for eczema, depression, anxiety, chronic low back pain who presented to [facility] ED on 08/07/2024 with complaints of progressive rash, sloughing of skin involving a significant portion of her body.  Patient reports similar flareups in the past due to her eczema but nothing as significant as this time.  Patient reports rash affecting her whole body including her eyes, around her mouth, neck, back, buttocks, legs/feet.  Patient feels that her skin is cracking and weeping from the sites; as well as significant edema.  She also reports chills but denies any fevers.  Due to significant discomfort her coworkers convinced her to come to the ED for further evaluation.  Patient reports that she has a diagnosis of eczema and about once a month she will have a flareup that requires her to go to urgent care to get a steroid shot and she will utilize triamcinolone  cream and Eucerin cream on her hands and feet with resolution/improvement of her symptoms in a few days.  Her primary care physician has referred her to dermatology recently but has not had official visit as of yet.  Patient's son as well as other family members also has severe eczema and currently maintained on Dupixent.  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  Her flareups typically begin with her feet/hands swelling.  In the ED, temperature 96.6 F, HR 123, RR 20, BP 123/84, SpO2 100% on room air.  WBC 27.9, hemoglobin 10.8, platelet count 528.  Sodium 142, potassium 4.1, chloride 105, CO2 21, glucose 93, BUN 7, creatinine 0.66.  BNP less than 50.0.  High sensitive troponin less than 15 x 2.  hCG negative.   Lactic acid 3.9.  Chest x-ray with no active cardiopulmonary disease process.  CT angiogram chest with no acute abnormality of the aorta, no pulmonary embolism, pathologic bilateral axillary adenopathy possibly inflammatory versus lymphoproliferative in nature.  Blood cultures x 2 drawn.  Patient was started on vancomycin , cefepime , Flagyl , received 1 L LR and analgesics.  EDP discussed with oncology, Dr. Autumn who recommended treating for possible infection.  TRH was consulted and patient was transferred to [facility] for further evaluation and management of severe eczema flare.  Assessment & Plan:   Severe eczema flare Toxic epidermal necrolysis, ruled out (no mucosal involvement) Cellulitis, diffuse Patient presenting to ED with progressive diffuse skin rash associated with weeping, pathologic lymphadenopathy noted on imaging.  WBC count elevated 27.9.  Patient has history of eczema with usual flares once monthly usually treated with IM Rocephin injection and topical steroids.  This occasion has been much worse, has been referred to dermatology outpatient but has yet to receive a visit.  Has known history of eczema as well as significant family history as well requiring therapy with Dupixent.  Patient denies mucositis or joint pain.  Admitting physician discussed with dermatologist on-call at [facility] after reviewing the case this is likely a major eczema flare and less likely TENS as there is no mucosal involvement; even with significant lymphadenopathy.  Dermatologist recommends IV steroids and topical triamcinolone . -- WBC 27.9>18.9>17.3>27.2>25.6 (likely steroid effect) -- ESR  40>30>24 -- CRP 15.7>5.3>1.9 --  Lactic acid 3.9>1.2 -- Solu-Medrol  40 mg IV every 24 hours -- Triamcinolone  ointment to affected areas twice daily -- Cefazolin  2 g IV every 8 hours -- Oxycodone  5 g p.o. every 4 hours as needed moderate pain -- CBC, ESR, CRP daily  Anxiety/depression: -- Celexa  20  mg p.o. daily   DVT prophylaxis:     Code Status: Full Code Family Communication: No family present at bedside this morning  Disposition Plan:  Level of care: Med-Surg Status is: Inpatient Remains inpatient appropriate because: IV antibiotics, IV steroids    Consultants:  Admitting physician discussed with dermatologist on-call at [facility]  Procedures:  None  Antimicrobials:  Vancomycin  11/18 - 11/20 Cefepime  11/18 - 11/21 Metronidazole  11/18 - 11/18 Cefazolin  11/21>>   Subjective: Patient seen examined bedside, lying in bed.  RN present at bedside.  Patient reports swelling to her hands much improved.  But continues with significant swelling and pain to her feet limiting her ambulation.  Remains on IV antibiotics.  Inflammatory markers improving.  No other questions or concerns at this time.  Denies any issues swallowing, no mucosal involvement of rash.  Further denies headache, no dizziness, no chest pain, no palpitations, no shortness of breath, no abdominal pain, no fever/chills/night sweats, no nausea/vomiting/diarrhea, no focal weakness, no fatigue, no paresthesia.  No acute events overnight per nursing staff.  Objective: Vitals:   08/10/24 0438 08/10/24 1201 08/10/24 2128 08/11/24 0423  BP: 130/78 130/81 132/73 133/80  Pulse: 90 78 84 69  Resp: 20 17 18 18   Temp: 98.2 F (36.8 C) 98.6 F (37 C) 99.3 F (37.4 C) 98.7 F (37.1 C)  TempSrc: Oral Oral Oral Oral  SpO2: 98% 99% 100% 100%  Weight:      Height:        Intake/Output Summary (Last 24 hours) at 08/11/2024 1244 Last data filed at 08/11/2024 0900 Gross per 24 hour  Intake 1039.87 ml  Output --  Net 1039.87 ml   Filed Weights   08/07/24 2259  Weight: 83 kg    Examination:  Physical Exam: GEN: NAD, alert and oriented x 3, obese HEENT: NCAT, PERRL, EOMI, sclera clear, MMM PULM: CTAB w/o wheezes/crackles, normal respiratory effort, on room air CV: RRR w/o M/G/R GI: abd  soft, NTND, + BS MSK: + peripheral edema, moves all extremities independently NEURO: No focal neurological deficit PSYCH: normal mood/affect Integumentary: Skin hyperpigmented, thickened with multiple cracks/fissures, edematous with the [place] of multiple sites as noted in pictures below.                     Data Reviewed: I have personally reviewed following labs and imaging studies  CBC: Recent Labs  Lab 08/07/24 2322 08/08/24 1705 08/09/24 0427 08/10/24 0349 08/11/24 0409  WBC 27.9* 18.9* 17.3* 27.2* 25.6*  NEUTROABS 13.9*  --   --   --   --   HGB 10.8* 9.5* 9.6* 7.9* 8.4*  HCT 33.1* 29.8* 30.7* 25.6* 26.5*  MCV 75.6* 80.1 79.1* 78.8* 79.1*  PLT 528* 435* 475* 421* 432*   Basic Metabolic Panel: Recent Labs  Lab 08/07/24 2322 08/09/24 0427  NA 142 142  K 4.1 4.3  CL 105 108  CO2 21* 24  GLUCOSE 93 127*  BUN 7 <5*  CREATININE 0.66 0.67  CALCIUM  8.2* 8.4*   GFR: Estimated Creatinine Clearance: 93.4 mL/min (by C-G formula based on SCr of 0.67 mg/dL). Liver Function Tests: No results for input(s): AST, ALT, ALKPHOS,  BILITOT, PROT, ALBUMIN in the last 168 hours. No results for input(s): LIPASE, AMYLASE in the last 168 hours. No results for input(s): AMMONIA in the last 168 hours. Coagulation Profile: No results for input(s): INR, PROTIME in the last 168 hours. Cardiac Enzymes: No results for input(s): CKTOTAL, CKMB, CKMBINDEX, TROPONINI in the last 168 hours. BNP (last 3 results) Recent Labs    08/07/24 2322  PROBNP <50.0   HbA1C: No results for input(s): HGBA1C in the last 72 hours. CBG: No results for input(s): GLUCAP in the last 168 hours. Lipid Profile: No results for input(s): CHOL, HDL, LDLCALC, TRIG, CHOLHDL, LDLDIRECT in the last 72 hours. Thyroid Function Tests: No results for input(s): TSH, T4TOTAL, FREET4, T3FREE, THYROIDAB in the last 72 hours. Anemia Panel: No  results for input(s): VITAMINB12, FOLATE, FERRITIN, TIBC, IRON, RETICCTPCT in the last 72 hours. Sepsis Labs: Recent Labs  Lab 08/08/24 0208 08/08/24 0439  LATICACIDVEN 3.9* 1.2    Recent Results (from the past 240 hours)  Culture, blood (single)     Status: None (Preliminary result)   Collection Time: 08/08/24  1:34 AM   Specimen: BLOOD LEFT FOREARM  Result Value Ref Range Status   Specimen Description   Final    BLOOD LEFT FOREARM Performed at [facility], [address] [place], [place] [address]    Special Requests   Final    BOTTLES DRAWN AEROBIC AND ANAEROBIC Blood Culture adequate volume Performed at [facility], [address] [place], [place] [address]    Culture   Final    NO GROWTH 3 DAYS Performed at [facility], [address], [place], [place] [address]    Report Status PENDING  Incomplete         Radiology Studies: No results found.       Scheduled Meds:  citalopram   20 mg Oral Daily   ferrous sulfate   325 mg Oral q morning   gabapentin   300 mg Oral TID   methylPREDNISolone  (SOLU-MEDROL ) injection  40 mg Intravenous Q24H   QUEtiapine   25 mg Oral QHS   sodium chloride  flush  3 mL Intravenous Q12H   triamcinolone  ointment   Topical BID   Continuous Infusions:   ceFAZolin  (ANCEF ) IV 2 g (08/11/24 0621)     LOS: 3 days    Time spent: 42 minutes spent on 08/11/2024 caring for this patient face-to-face including chart review, ordering labs/tests, documenting, discussion with nursing staff, consultants, updating family and interview/physical exam    Camellia PARAS Lavaun Greenfield, DO [facility] Available via Epic secure chat 7am-7pm After these hours, please refer to coverage provider listed on [email or web address] 08/11/2024, 12:44 PM

## 2024-08-12 DIAGNOSIS — L309 Dermatitis, unspecified: Secondary | ICD-10-CM | POA: Diagnosis not present

## 2024-08-12 LAB — SEDIMENTATION RATE: Sed Rate: 20 mm/h (ref 0–22)

## 2024-08-12 LAB — CBC
HCT: 26.3 % — ABNORMAL LOW (ref 36.0–46.0)
Hemoglobin: 8.3 g/dL — ABNORMAL LOW (ref 12.0–15.0)
MCH: 24.9 pg — ABNORMAL LOW (ref 26.0–34.0)
MCHC: 31.6 g/dL (ref 30.0–36.0)
MCV: 78.7 fL — ABNORMAL LOW (ref 80.0–100.0)
Platelets: 423 K/uL — ABNORMAL HIGH (ref 150–400)
RBC: 3.34 MIL/uL — ABNORMAL LOW (ref 3.87–5.11)
RDW: 19 % — ABNORMAL HIGH (ref 11.5–15.5)
WBC: 28 K/uL — ABNORMAL HIGH (ref 4.0–10.5)
nRBC: 0.2 % (ref 0.0–0.2)

## 2024-08-12 LAB — C-REACTIVE PROTEIN: CRP: 0.9 mg/dL (ref <1.0)

## 2024-08-12 MED ORDER — OXYCODONE HCL 5 MG PO TABS
10.0000 mg | ORAL_TABLET | ORAL | Status: DC | PRN
  Administered 2024-08-12 – 2024-08-13 (×7): 15 mg via ORAL
  Filled 2024-08-12 (×3): qty 3
  Filled 2024-08-12: qty 2
  Filled 2024-08-12 (×3): qty 3

## 2024-08-12 NOTE — Progress Notes (Signed)
 PROGRESS NOTE    Ebony Hansen  FMW:[id number] DOB: May 09, 1981 DOA: 08/07/2024 PCP: [facility]    Brief Narrative:   Ebony Hansen is a 43 y.o. female with past medical history significant for eczema, depression, anxiety, chronic low back pain who presented to [facility] ED on 08/07/2024 with complaints of progressive rash, sloughing of skin involving a significant portion of her body.  Patient reports similar flareups in the past due to her eczema but nothing as significant as this time.  Patient reports rash affecting her whole body including her eyes, around her mouth, neck, back, buttocks, legs/feet.  Patient feels that her skin is cracking and weeping from the sites; as well as significant edema.  She also reports chills but denies any fevers.  Due to significant discomfort her coworkers convinced her to come to the ED for further evaluation.  Patient reports that she has a diagnosis of eczema and about once a month she will have a flareup that requires her to go to urgent care to get a steroid shot and she will utilize triamcinolone  cream and Eucerin cream on her hands and feet with resolution/improvement of her symptoms in a few days.  Her primary care physician has referred her to dermatology recently but has not had official visit as of yet.  Patient's son as well as other family members also has severe eczema and currently maintained on Dupixent.  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  Her flareups typically begin with her feet/hands swelling.  In the ED, temperature 96.6 F, HR 123, RR 20, BP 123/84, SpO2 100% on room air.  WBC 27.9, hemoglobin 10.8, platelet count 528.  Sodium 142, potassium 4.1, chloride 105, CO2 21, glucose 93, BUN 7, creatinine 0.66.  BNP less than 50.0.  High sensitive troponin less than 15 x 2.  hCG negative.   Lactic acid 3.9.  Chest x-ray with no active cardiopulmonary disease process.  CT angiogram chest with no acute abnormality of the aorta, no pulmonary embolism, pathologic bilateral axillary adenopathy possibly inflammatory versus lymphoproliferative in nature.  Blood cultures x 2 drawn.  Patient was started on vancomycin , cefepime , Flagyl , received 1 L LR and analgesics.  EDP discussed with oncology, Dr. Autumn who recommended treating for possible infection.  TRH was consulted and patient was transferred to [facility] for further evaluation and management of severe eczema flare.  Assessment & Plan:   Severe eczema flare Toxic epidermal necrolysis, ruled out (no mucosal involvement) Cellulitis, diffuse Patient presenting to ED with progressive diffuse skin rash associated with weeping, pathologic lymphadenopathy noted on imaging.  WBC count elevated 27.9.  Patient has history of eczema with usual flares once monthly usually treated with IM Rocephin injection and topical steroids.  This occasion has been much worse, has been referred to dermatology outpatient but has yet to receive a visit.  Has known history of eczema as well as significant family history as well requiring therapy with Dupixent.  Patient denies mucositis or joint pain.  Admitting physician discussed with dermatologist on-call at [facility] after reviewing the case this is likely a major eczema flare and less likely TENS as there is no mucosal involvement; even with significant lymphadenopathy.  Dermatologist recommends IV steroids and topical triamcinolone . -- WBC 27.9>18.9>17.3>27.2>25.6 (likely steroid effect) -- ESR  40>30>24>20 -- CRP 15.7>5.3>1.9>0.9 --  Lactic acid 3.9>1.2 -- Solu-Medrol  40 mg IV every 24 hours -- Triamcinolone  ointment to affected areas twice daily -- Cefazolin  2 g IV every 8 hours -- Oxycodone  10-15 g p.o. every 4 hours as needed moderate/severe pain -- dilaudid  0.5 mg IV q3h PRN  breakthrough pain -- Needs close outpatient f/u with Dermatology  Anxiety/depression: -- Celexa  20 mg p.o. daily   DVT prophylaxis:     Code Status: Full Code Family Communication: No family present at bedside this morning  Disposition Plan:  Level of care: Med-Surg Status is: Inpatient Remains inpatient appropriate because: IV antibiotics, IV steroids    Consultants:  Admitting physician discussed with dermatologist on-call at [facility]  Procedures:  None  Antimicrobials:  Vancomycin  11/18 - 11/20 Cefepime  11/18 - 11/21 Metronidazole  11/18 - 11/18 Cefazolin  11/21>>   Subjective: Patient seen examined bedside, lying in bed.  Lying in bed.  Reports swelling to face and hands much improved.  Continues with pain to her bilateral little feet restricting her ambulation, but overall no further bleeding from wounds.  Inflammatory markers now within normal limits.  Remains on IV Solu-Medrol .  Pending PT evaluation. No other questions or concerns at this time.  Denies any issues swallowing, no mucosal involvement of rash.  Further denies headache, no dizziness, no chest pain, no palpitations, no shortness of breath, no abdominal pain, no fever/chills/night sweats, no nausea/vomiting/diarrhea, no focal weakness, no fatigue, no paresthesia.  No acute events overnight per nursing staff.  Objective: Vitals:   08/11/24 0423 08/11/24 1358 08/11/24 2024 08/12/24 0527  BP: 133/80 (!) 147/97 (!) 158/82 (!) 157/89  Pulse: 69 73 66 73  Resp: 18 16 18 18   Temp: 98.7 F (37.1 C) (!) 100.5 F (38.1 C) 99 F (37.2 C) 98.6 F (37 C)  TempSrc: Oral Oral Oral Oral  SpO2: 100% 98% 100% 100%  Weight:      Height:        Intake/Output Summary (Last 24 hours) at 08/12/2024 1043 Last data filed at 08/12/2024 0900 Gross per 24 hour  Intake 240 ml  Output --  Net 240 ml   Filed Weights   08/07/24 2259  Weight: 83 kg    Examination:  Physical Exam: GEN: NAD,  alert and oriented x 3, obese HEENT: NCAT, PERRL, EOMI, sclera clear, MMM PULM: CTAB w/o wheezes/crackles, normal respiratory effort, on room air CV: RRR w/o M/G/R GI: abd soft, NTND, + BS MSK: + peripheral edema, moves all extremities independently NEURO: No focal neurological deficit PSYCH: normal mood/affect Integumentary: Skin hyperpigmented, thickened with multiple cracks/fissures, edematous with the [place] of multiple sites as noted in pictures below.                     Data Reviewed: I have personally reviewed following labs and imaging studies  CBC: Recent Labs  Lab 08/07/24 2322 08/08/24 1705 08/09/24 0427 08/10/24 0349 08/11/24 0409 08/12/24 0412  WBC 27.9* 18.9* 17.3* 27.2* 25.6* 28.0*  NEUTROABS 13.9*  --   --   --   --   --   HGB 10.8* 9.5* 9.6* 7.9* 8.4* 8.3*  HCT 33.1* 29.8* 30.7* 25.6* 26.5* 26.3*  MCV 75.6* 80.1 79.1* 78.8* 79.1* 78.7*  PLT 528* 435* 475* 421* 432* 423*   Basic Metabolic Panel: Recent Labs  Lab 08/07/24 2322 08/09/24 0427  NA 142 142  K 4.1 4.3  CL 105 108  CO2 21* 24  GLUCOSE 93 127*  BUN 7 <5*  CREATININE 0.66 0.67  CALCIUM  8.2* 8.4*   GFR: Estimated Creatinine Clearance: 93.4 mL/min (by C-G formula based on SCr of 0.67 mg/dL). Liver Function Tests: No results for input(s): AST, ALT, ALKPHOS, BILITOT, PROT, ALBUMIN in the last 168 hours. No results for input(s): LIPASE, AMYLASE in the last 168 hours. No results for input(s): AMMONIA in the last 168 hours. Coagulation Profile: No results for input(s): INR, PROTIME in the last 168 hours. Cardiac Enzymes: No results for input(s): CKTOTAL, CKMB, CKMBINDEX, TROPONINI in the last 168 hours. BNP (last 3 results) Recent Labs    08/07/24 2322  PROBNP <50.0   HbA1C: No results for input(s): HGBA1C in the last 72 hours. CBG: No results for input(s): GLUCAP in the last 168 hours. Lipid Profile: No results for input(s):  CHOL, HDL, LDLCALC, TRIG, CHOLHDL, LDLDIRECT in the last 72 hours. Thyroid Function Tests: No results for input(s): TSH, T4TOTAL, FREET4, T3FREE, THYROIDAB in the last 72 hours. Anemia Panel: No results for input(s): VITAMINB12, FOLATE, FERRITIN, TIBC, IRON, RETICCTPCT in the last 72 hours. Sepsis Labs: Recent Labs  Lab 08/08/24 0208 08/08/24 0439  LATICACIDVEN 3.9* 1.2    Recent Results (from the past 240 hours)  Culture, blood (single)     Status: None (Preliminary result)   Collection Time: 08/08/24  1:34 AM   Specimen: BLOOD LEFT FOREARM  Result Value Ref Range Status   Specimen Description   Final    BLOOD LEFT FOREARM Performed at [facility], [address] [place], [place] [address]    Special Requests   Final    BOTTLES DRAWN AEROBIC AND ANAEROBIC Blood Culture adequate volume Performed at [facility], [address], [place], [place] [address]    Culture   Final    NO GROWTH 3 DAYS Performed at [facility], [address] [place], [place] [address]    Report Status PENDING  Incomplete         Radiology Studies: No results found.       Scheduled Meds:  citalopram   20 mg Oral Daily   ferrous sulfate   325 mg Oral q morning   gabapentin   300 mg Oral TID   methylPREDNISolone  (SOLU-MEDROL ) injection  40 mg Intravenous Q24H   QUEtiapine   25 mg Oral QHS   sodium chloride  flush  3 mL Intravenous Q12H   triamcinolone  ointment   Topical BID   Continuous Infusions:   ceFAZolin  (ANCEF ) IV 2 g (08/12/24 0540)     LOS: 4 days    Time spent: 42 minutes spent on 08/12/2024 caring for this patient face-to-face including chart review, ordering labs/tests, documenting, discussion with nursing staff, consultants, updating family and interview/physical exam    Ebony PARAS Quida Glasser, DO [facility] Available via Epic secure chat 7am-7pm After these hours, please refer to coverage provider listed  on [email or web address] 08/12/2024, 10:43 AM

## 2024-08-12 NOTE — Plan of Care (Signed)
   Problem: Education: Goal: Knowledge of General Education information will improve Description: Including pain rating scale, medication(s)/side effects and non-pharmacologic comfort measures Outcome: Progressing   Problem: Clinical Measurements: Goal: Will remain free from infection Outcome: Progressing

## 2024-08-12 NOTE — Progress Notes (Signed)
 Dsg change performed to BLEs. Pt tolerated with no problem.

## 2024-08-12 NOTE — Plan of Care (Signed)

## 2024-08-13 DIAGNOSIS — L309 Dermatitis, unspecified: Secondary | ICD-10-CM | POA: Diagnosis not present

## 2024-08-13 LAB — CULTURE, BLOOD (SINGLE)
Culture: NO GROWTH
Special Requests: ADEQUATE

## 2024-08-13 MED ORDER — TRIAMCINOLONE ACETONIDE 0.1 % EX CREA
TOPICAL_CREAM | Freq: Two times a day (BID) | CUTANEOUS | Status: DC
  Filled 2024-08-13: qty 454

## 2024-08-13 NOTE — TOC Progression Note (Signed)
 Transition of Care [facility]) - Progression Note   Patient Details  Name: Ebony Hansen MRN: [id number] Date of Birth: 01/11/1981  [facility] CM/SW Contact  Duwaine GORMAN Aran, LCSW Phone Number: 08/13/2024, 2:15 PM  Clinical Narrative: PT evaluation did not recommend any PT follow up, but did recommend a rolling walker. Patient is agreeable to being set up with a rolling walker. DME referral faxed in hub.  Expected Discharge Plan: Home/Self Care Barriers to Discharge: Continued Medical Work up  Expected Discharge Plan and Services In-house Referral: Clinical Social Work Living arrangements for the past 2 months: Single Family Home            DME Arranged: Optometrist spoke with at DME Agency: Referral made in hub  Social Drivers of Health (SDOH) Interventions SDOH Screenings   Food Insecurity: No Food Insecurity (08/08/2024)  Recent Concern: Food Insecurity - Food Insecurity Present (06/04/2024)   Received from [facility]  Housing: Low Risk  (08/08/2024)  Recent Concern: Housing - High Risk (06/04/2024)   Received from [facility]  Transportation Needs: No Transportation Needs (08/08/2024)  Utilities: Not At Risk (08/08/2024)  Financial Resource Strain: Low Risk  (06/04/2024)   Received from [facility]  Physical Activity: Insufficiently Active (06/04/2024)   Received from [facility]  Social Connections: Unknown (08/08/2024)  Stress: Stress Concern Present (06/04/2024)   Received from [facility]  Tobacco Use: Low Risk  (08/08/2024)   Readmission Risk Interventions     No data to display

## 2024-08-13 NOTE — Plan of Care (Signed)

## 2024-08-13 NOTE — Evaluation (Signed)
 Physical Therapy Evaluation Patient Details Name: Ebony Hansen MRN: [id number] DOB: October 04, 1980 Today's Date: 08/13/2024  History of Present Illness  43 y.o. female with who presented to [facility] ED on 08/07/2024 with complaints of progressive rash, sloughing of skin involving a significant portion of her body. Severe eczema flare. Cellulitis, diffuse  PMH: eczema, depression, anxiety, chronic low back pain  Clinical Impression  Pt admitted with above diagnosis. 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  Pt able to amb 12' with RW and CGA-supervision. Heavily reliant on RW and will likely need RW for home use initially. Pt educated on ankle/knee/ hip AROM to assist with edema control and pain management. Will follow in acute setting, no post acute f/u indicated at this time.   Pt currently with functional limitations due to the deficits listed below (see PT Problem List). Pt will benefit from acute skilled PT to increase their independence and safety with mobility to allow discharge.           If plan is discharge home, recommend the following: Help with stairs or ramp for entrance;Assist for transportation   Can travel by private vehicle        Equipment Recommendations Rolling walker (2 wheels)  Recommendations for Other Services       Functional Status Assessment Patient has had a recent decline in their functional status and demonstrates the ability to make significant improvements in function in a reasonable and predictable amount of time.     Precautions / Restrictions Precautions Precautions: Fall Restrictions Weight Bearing Restrictions Per Provider Order: No      Mobility  Bed Mobility Overal bed mobility: Modified Independent                  Transfers Overall transfer level: Needs assistance Equipment used: Rolling walker (2 wheels), None Transfers: Sit to/from Stand Sit to Stand: Contact guard  assist, Min assist           General transfer comment: cues for hand placement    Ambulation/Gait Ambulation/Gait assistance: Contact guard assist, Supervision Gait Distance (Feet): 80 Feet Assistive device: Rolling walker (2 wheels) Gait Pattern/deviations: Step-to pattern, Decreased stance time - left Gait velocity: decr     General Gait Details: cues for use of RW to offload LEs as needed for pain control, RW position from self  Stairs            Wheelchair Mobility     Tilt Bed    Modified Rankin (Stroke Patients Only)       Balance Overall balance assessment: No apparent balance deficits (not formally assessed)                                           Pertinent Vitals/Pain Pain Assessment Pain Assessment: Faces Faces Pain Scale: Hurts little more Pain Location: bil LEs/feet Pain Descriptors / Indicators: Sore, Tightness Pain Intervention(s): Limited activity within patient's tolerance, Monitored during session, Premedicated before session    Home Living Family/patient expects to be discharged to:: Private residence Living Arrangements: Parent Available Help at Discharge: Family Type of Home: House Home Access: Stairs to enter   Secretary/administrator of Steps: 2-3   Home Layout: One level Home Equipment: None      Prior Function Prior Level of Function : Independent/Modified Independent  Extremity/Trunk Assessment   Upper Extremity Assessment Upper Extremity Assessment: Overall WFL for tasks assessed    Lower Extremity Assessment Lower Extremity Assessment: RLE deficits/detail;LLE deficits/detail RLE Deficits / Details: ankle ROM limited by pain and edema; knee and hip grossly AROM WFL; strength at least 3+/5 - not fully tested d/t pain with  light touch RLE: Unable to fully assess due to pain LLE Deficits / Details: ankle ROM limited by pain and edema; knee and hip grossly AROM WFL; strength  at least 3+/5 - not fully tested d/t pain with light touch       Communication   Communication Communication: No apparent difficulties    Cognition Arousal: Alert Behavior During Therapy: WFL for tasks assessed/performed   PT - Cognitive impairments: No apparent impairments                         Following commands: Intact       Cueing Cueing Techniques: Verbal cues     General Comments      Exercises     Assessment/Plan    PT Assessment Patient needs continued PT services  PT Problem List Decreased activity tolerance;Decreased balance;Decreased mobility;Decreased knowledge of use of DME;Pain;Decreased range of motion       PT Treatment Interventions DME instruction;Therapeutic exercise;Gait training;Functional mobility training;Therapeutic activities;Patient/family education    PT Goals (Current goals can be found in the Care Plan section)  Acute Rehab PT Goals PT Goal Formulation: With patient Time For Goal Achievement: 08/27/24 Potential to Achieve Goals: Good    Frequency Min 2X/week     Co-evaluation               AM-PAC PT 6 Clicks Mobility  Outcome Measure Help needed turning from your back to your side while in a flat bed without using bedrails?: None Help needed moving from lying on your back to sitting on the side of a flat bed without using bedrails?: None Help needed moving to and from a bed to a chair (including a wheelchair)?: A Little Help needed standing up from a chair using your arms (e.g., wheelchair or bedside chair)?: A Little Help needed to walk in hospital room?: A Little Help needed climbing 3-5 steps with a railing? : A Little 6 Click Score: 20    End of Session Equipment Utilized During Treatment: Gait belt Activity Tolerance: Patient tolerated treatment well Patient left: with call bell/phone within reach;in bed;with family/visitor present Nurse Communication: Mobility status PT Visit Diagnosis: Other  abnormalities of gait and mobility (R26.89)    Time: 8793-8769 PT Time Calculation (min) (ACUTE ONLY): 24 min   Charges:   PT Evaluation $PT Eval Low Complexity: 1 Low PT Treatments $Gait Training: 8-22 mins PT General Charges $$ ACUTE PT VISIT: 1 Visit         Nikolos Billig, PT  Acute Rehab Dept [facility]) [phone]  08/13/2024   [facility] 08/13/2024, 12:56 PM

## 2024-08-13 NOTE — Progress Notes (Signed)
 PROGRESS NOTE    Ebony Hansen  FMW:[id number] DOB: 05/23/1981 DOA: 08/07/2024 PCP: [facility]    Brief Narrative:   Ebony Hansen is a 43 y.o. female with past medical history significant for eczema, depression, anxiety, chronic low back pain who presented to [facility] ED on 08/07/2024 with complaints of progressive rash, sloughing of skin involving a significant portion of her body.  Patient reports similar flareups in the past due to her eczema but nothing as significant as this time.  Patient reports rash affecting her whole body including her eyes, around her mouth, neck, back, buttocks, legs/feet.  Patient feels that her skin is cracking and weeping from the sites; as well as significant edema.  She also reports chills but denies any fevers.  Due to significant discomfort her coworkers convinced her to come to the ED for further evaluation.  Patient reports that she has a diagnosis of eczema and about once a month she will have a flareup that requires her to go to urgent care to get a steroid shot and she will utilize triamcinolone  cream and Eucerin cream on her hands and feet with resolution/improvement of her symptoms in a few days.  Her primary care physician has referred her to dermatology recently but has not had official visit as of yet.  Patient's son as well as other family members also has severe eczema and currently maintained on Dupixent.  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  Her flareups typically begin with her feet/hands swelling.  In the ED, temperature 96.6 F, HR 123, RR 20, BP 123/84, SpO2 100% on room air.  WBC 27.9, hemoglobin 10.8, platelet count 528.  Sodium 142, potassium 4.1, chloride 105, CO2 21, glucose 93, BUN 7, creatinine 0.66.  BNP less than 50.0.  High sensitive troponin less than 15 x 2.  hCG negative.   Lactic acid 3.9.  Chest x-ray with no active cardiopulmonary disease process.  CT angiogram chest with no acute abnormality of the aorta, no pulmonary embolism, pathologic bilateral axillary adenopathy possibly inflammatory versus lymphoproliferative in nature.  Blood cultures x 2 drawn.  Patient was started on vancomycin , cefepime , Flagyl , received 1 L LR and analgesics.  EDP discussed with oncology, Dr. Autumn who recommended treating for possible infection.  TRH was consulted and patient was transferred to [facility] for further evaluation and management of severe eczema flare.  Assessment & Plan:   Severe eczema flare Toxic epidermal necrolysis, ruled out (no mucosal involvement) Cellulitis, diffuse Patient presenting to ED with progressive diffuse skin rash associated with weeping, pathologic lymphadenopathy noted on imaging.  WBC count elevated 27.9.  Patient has history of eczema with usual flares once monthly usually treated with IM Rocephin injection and topical steroids.  This occasion has been much worse, has been referred to dermatology outpatient but has yet to receive a visit.  Has known history of eczema as well as significant family history as well requiring therapy with Dupixent.  Patient denies mucositis or joint pain.  Admitting physician discussed with dermatologist on-call at [facility] after reviewing the case this is likely a major eczema flare and less likely TENS as there is no mucosal involvement; even with significant lymphadenopathy.  Dermatologist recommends IV steroids and topical triamcinolone . -- WBC 27.9>18.9>17.3>27.2>25.6 (likely steroid effect) -- ESR  40>30>24>20 -- CRP 15.7>5.3>1.9>0.9 --  Lactic acid 3.9>1.2 -- Solu-Medrol  40 mg IV every 24 hours -- Triamcinolone  cream to affected areas twice daily -- Cefazolin  2 g IV every 8 hours -- Oxycodone  10-15 g p.o. every 4 hours as needed moderate/severe pain -- dilaudid  0.5 mg IV q3h PRN breakthrough  pain -- Needs close outpatient f/u with Dermatology; scheduled on 08/24/2024  Anxiety/depression: -- Celexa  20 mg p.o. daily   DVT prophylaxis:     Code Status: Full Code Family Communication: No family present at bedside this morning  Disposition Plan:  Level of care: Med-Surg Status is: Inpatient Remains inpatient appropriate because: IV antibiotics, IV steroids; anticipate discharge home tomorrow    Consultants:  Admitting physician discussed with dermatologist on-call at [facility]  Procedures:  None  Antimicrobials:  Vancomycin  11/18 - 11/20 Cefepime  11/18 - 11/21 Metronidazole  11/18 - 11/18 Cefazolin  11/21>>   Subjective: Patient seen examined bedside, lying in bed.  Lying in bed.  Reports itching to her skin.  Continues with improvement in swelling to face and hands, still has significant swelling to her feet but was able to ambulate some yesterday.  Continues on IV Solu-Medrol , topical triamcinolone  cream.  Discussed anticipated discharge home tomorrow.  Has outpatient follow-up scheduled with dermatology on 08/24/2024. Denies headache, no dizziness, no chest pain, no palpitations, no shortness of breath, no abdominal pain, no fever/chills/night sweats, no nausea/vomiting/diarrhea, no focal weakness, no fatigue, no paresthesia.  No acute events overnight per nursing staff.  Objective: Vitals:   08/12/24 0527 08/12/24 1430 08/12/24 2038 08/13/24 0531  BP: (!) 157/89 (!) 142/89 (!) 167/73 127/73  Pulse: 73 79 62 64  Resp: 18 18 16    Temp: 98.6 F (37 C) 98.7 F (37.1 C) 97.7 F (36.5 C) 98 F (36.7 C)  TempSrc: Oral Oral Oral Oral  SpO2: 100% 95% 100% 100%  Weight:      Height:        Intake/Output Summary (Last 24 hours) at 08/13/2024 1115 Last data filed at 08/13/2024 0530 Gross per 24 hour  Intake 720 ml  Output --  Net 720 ml   Filed Weights   08/07/24 2259  Weight: 83 kg    Examination:  Physical Exam: GEN: NAD, alert  and oriented x 3, obese HEENT: NCAT, PERRL, EOMI, sclera clear, MMM PULM: CTAB w/o wheezes/crackles, normal respiratory effort, on room air CV: RRR w/o M/G/R GI: abd soft, NTND, + BS MSK: + peripheral edema, moves all extremities independently NEURO: No focal neurological deficit PSYCH: normal mood/affect Integumentary: Skin hyperpigmented, thickened with multiple cracks/fissures, edematous with the [place] of multiple sites as noted in pictures below.                     Data Reviewed: I have personally reviewed following labs and imaging studies  CBC: Recent Labs  Lab 08/07/24 2322 08/08/24 1705 08/09/24 0427 08/10/24 0349 08/11/24 0409 08/12/24 0412  WBC 27.9* 18.9* 17.3* 27.2* 25.6* 28.0*  NEUTROABS 13.9*  --   --   --   --   --   HGB 10.8* 9.5* 9.6* 7.9* 8.4* 8.3*  HCT 33.1* 29.8* 30.7* 25.6* 26.5* 26.3*  MCV 75.6* 80.1 79.1* 78.8* 79.1* 78.7*  PLT 528* 435* 475* 421* 432* 423*   Basic Metabolic Panel: Recent Labs  Lab 08/07/24 2322 08/09/24 0427  NA 142 142  K 4.1 4.3  CL 105 108  CO2 21* 24  GLUCOSE 93 127*  BUN 7 <5*  CREATININE 0.66 0.67  CALCIUM  8.2* 8.4*   GFR: Estimated Creatinine Clearance: 93.4 mL/min (by C-G formula based on SCr of 0.67 mg/dL). Liver Function Tests: No results for input(s): AST, ALT, ALKPHOS, BILITOT, PROT, ALBUMIN in the last 168 hours. No results for input(s): LIPASE, AMYLASE in the last 168 hours. No results for input(s): AMMONIA in the last 168 hours. Coagulation Profile: No results for input(s): INR, PROTIME in the last 168 hours. Cardiac Enzymes: No results for input(s): CKTOTAL, CKMB, CKMBINDEX, TROPONINI in the last 168 hours. BNP (last 3 results) Recent Labs    08/07/24 2322  PROBNP <50.0   HbA1C: No results for input(s): HGBA1C in the last 72 hours. CBG: No results for input(s): GLUCAP in the last 168 hours. Lipid Profile: No results for input(s): CHOL,  HDL, LDLCALC, TRIG, CHOLHDL, LDLDIRECT in the last 72 hours. Thyroid Function Tests: No results for input(s): TSH, T4TOTAL, FREET4, T3FREE, THYROIDAB in the last 72 hours. Anemia Panel: No results for input(s): VITAMINB12, FOLATE, FERRITIN, TIBC, IRON, RETICCTPCT in the last 72 hours. Sepsis Labs: Recent Labs  Lab 08/08/24 0208 08/08/24 0439  LATICACIDVEN 3.9* 1.2    Recent Results (from the past 240 hours)  Culture, blood (single)     Status: None   Collection Time: 08/08/24  1:34 AM   Specimen: BLOOD LEFT FOREARM  Result Value Ref Range Status   Specimen Description   Final    BLOOD LEFT FOREARM Performed at [facility], [address] [place], [place] [address]    Special Requests   Final    BOTTLES DRAWN AEROBIC AND ANAEROBIC Blood Culture adequate volume Performed at [facility], [address], [place], [place] [address]    Culture   Final    NO GROWTH 5 DAYS Performed at [facility], [address] [place], [place] [address]    Report Status 08/13/2024 FINAL  Final         Radiology Studies: No results found.       Scheduled Meds:  citalopram   20 mg Oral Daily   ferrous sulfate   325 mg Oral q morning   gabapentin   300 mg Oral TID   methylPREDNISolone  (SOLU-MEDROL ) injection  40 mg Intravenous Q24H   QUEtiapine   25 mg Oral QHS   sodium chloride  flush  3 mL Intravenous Q12H   triamcinolone  cream   Topical BID   Continuous Infusions:   ceFAZolin  (ANCEF ) IV 2 g (08/13/24 0551)     LOS: 5 days    Time spent: 42 minutes spent on 08/13/2024 caring for this patient face-to-face including chart review, ordering labs/tests, documenting, discussion with nursing staff, consultants, updating family and interview/physical exam    Camellia PARAS Jerimyah Vandunk, DO [facility] Available via Epic secure chat 7am-7pm After these hours, please refer to coverage provider listed on [email or web address] 08/13/2024,  11:15 AM

## 2024-08-14 ENCOUNTER — Other Ambulatory Visit (HOSPITAL_COMMUNITY): Payer: Self-pay

## 2024-08-14 DIAGNOSIS — L309 Dermatitis, unspecified: Secondary | ICD-10-CM | POA: Diagnosis not present

## 2024-08-14 DIAGNOSIS — L03818 Cellulitis of other sites: Secondary | ICD-10-CM

## 2024-08-14 MED ORDER — TRIAMCINOLONE ACETONIDE 0.1 % EX CREA
TOPICAL_CREAM | Freq: Two times a day (BID) | CUTANEOUS | 2 refills | Status: AC
  Filled 2024-08-14: qty 454, 30d supply, fill #0

## 2024-08-14 MED ORDER — OXYCODONE HCL 10 MG PO TABS
10.0000 mg | ORAL_TABLET | ORAL | 0 refills | Status: AC | PRN
  Filled 2024-08-14: qty 40, 5d supply, fill #0

## 2024-08-14 MED ORDER — CEPHALEXIN 500 MG PO CAPS
500.0000 mg | ORAL_CAPSULE | Freq: Four times a day (QID) | ORAL | 0 refills | Status: AC
  Filled 2024-08-14: qty 12, 3d supply, fill #0

## 2024-08-14 MED ORDER — PREDNISONE 10 MG PO TABS
ORAL_TABLET | ORAL | 0 refills | Status: AC
  Filled 2024-08-14: qty 30, 12d supply, fill #0

## 2024-08-14 MED ORDER — EUCERIN ORIGINAL HEALING EX CREA
TOPICAL_CREAM | Freq: Two times a day (BID) | CUTANEOUS | 0 refills | Status: AC
  Filled 2024-08-14: qty 454, 30d supply, fill #0

## 2024-08-14 NOTE — Progress Notes (Signed)
 Discharge meds in a secure bag delivered to patient in room - patient verbalized an understanding that pain meds were not covered by insurance, cash price.

## 2024-08-14 NOTE — TOC Progression Note (Signed)
 Transition of Care [facility]) - Progression Note   Patient Details  Name: Ebony Hansen MRN: [id number] Date of Birth: [birth year]  [facility] CM/SW Contact  Duwaine GORMAN Aran, LCSW Phone Number: 08/14/2024, 9:30 AM  Clinical Narrative: CSW followed up with Zack with [employer] regarding rolling walker. [employer] to deliver rolling walker to patient's room. RN updated.  Expected Discharge Plan: Home/Self Care Barriers to Discharge: Continued Medical Work up  Expected Discharge Plan and Services In-house Referral: Clinical Social Work Living arrangements for the past 2 months: Single Family Home            DME Arranged: Walker rolling DME Agency: [employer] Date DME Agency Contacted: 08/14/24 Time DME Agency Contacted: [phone] Representative spoke with at DME Agency: Zack  Social Drivers of Health (SDOH) Interventions SDOH Screenings   Food Insecurity: No Food Insecurity (08/08/2024)  Recent Concern: Food Insecurity - Food Insecurity Present (06/04/2024)   Received from [facility]  Housing: Low Risk  (08/08/2024)  Recent Concern: Housing - High Risk (06/04/2024)   Received from [facility]  Transportation Needs: No Transportation Needs (08/08/2024)  Utilities: Not At Risk (08/08/2024)  Financial Resource Strain: Low Risk  (06/04/2024)   Received from [facility]  Physical Activity: Insufficiently Active (06/04/2024)   Received from [facility]  Social Connections: Unknown (08/08/2024)  Stress: Stress Concern Present (06/04/2024)   Received from [facility]  Tobacco Use: Low Risk  (08/08/2024)   Readmission Risk Interventions     No data to display

## 2024-08-14 NOTE — Discharge Summary (Signed)
 Physician Discharge Summary  Ebony Hansen FMW:[id number] DOB: Jun 02, 1981 DOA: 08/07/2024  PCP: [facility]  Admit date: 08/07/2024 Discharge date: 08/14/2024  Admitted From: Home Disposition: Home  Recommendations for Outpatient Follow-up:  Follow up with PCP in 1-2 weeks Follow-up with dermatology as scheduled on 08/24/2024 for definitive treatment of severe eczema, would benefit from initiation of Dupixent Continue prednisone  taper, triamcinolone /Eucerin topical cream twice daily to affected areas Continue Keflex  to complete 10-day course of antibiotics for cellulitis secondary to severe eczema flare Please obtain CBC after prednisone  taper  Home Health: No Equipment/Devices: Rolling walker  Discharge Condition: Stable CODE STATUS: Full code Diet recommendation: Regular diet  History of present illness:  Ebony Hansen is a 43 y.o. female with past medical history significant for eczema, depression, anxiety, chronic low back pain who presented to [facility] ED on 08/07/2024 with complaints of progressive rash, sloughing of skin involving a significant portion of her body.  Patient reports similar flareups in the past due to her eczema but nothing as significant as this time.  Patient reports rash affecting her whole body including her eyes, around her mouth, neck, back, buttocks, legs/feet.  Patient feels that her skin is cracking and weeping from the sites; as well as significant edema.  She also reports chills but denies any fevers.  Due to significant discomfort her coworkers convinced her to come to the ED for further evaluation.   Patient reports that she has a diagnosis of eczema and about once a month she will have a flareup that requires her to go to urgent care to get a steroid shot and she will utilize triamcinolone  cream and Eucerin cream on her hands and feet with resolution/improvement of her symptoms in a few days.  Her primary care  physician has referred her to dermatology recently but has not had official visit as of yet.  Patient's son as well as other family members also has severe eczema and currently maintained on Dupixent.  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  Her flareups typically begin with her feet/hands swelling.   In the ED, temperature 96.6 F, HR 123, RR 20, BP 123/84, SpO2 100% on room air.  WBC 27.9, hemoglobin 10.8, platelet count 528.  Sodium 142, potassium 4.1, chloride 105, CO2 21, glucose 93, BUN 7, creatinine 0.66.  BNP less than 50.0.  High sensitive troponin less than 15 x 2.  hCG negative.  Lactic acid 3.9.  Chest x-ray with no active cardiopulmonary disease process.  CT angiogram chest with no acute abnormality of the aorta, no pulmonary embolism, pathologic bilateral axillary adenopathy possibly inflammatory versus lymphoproliferative in nature.  Blood cultures x 2 drawn.  Patient was started on vancomycin , cefepime , Flagyl , received 1 L LR and analgesics.  EDP discussed with oncology, Dr. Autumn who recommended treating for possible infection.  TRH was consulted and patient was transferred to [facility] for further evaluation and management of severe eczema flare.  Hospital course:  Severe eczema flare Toxic epidermal necrolysis, ruled out (no mucosal involvement) Cellulitis, diffuse Patient presenting to ED with progressive diffuse skin rash associated with weeping, pathologic lymphadenopathy noted on imaging.  WBC count elevated 27.9.  Patient has history of eczema with usual flares once monthly usually treated with IM Rocephin injection and topical steroids.  This occasion has been much worse, has  been referred to dermatology outpatient but has yet to receive a visit.  Has known history of eczema as well as significant family history as well requiring therapy with Dupixent.   Patient denies mucositis or joint pain.  Admitting physician discussed with dermatologist on-call at [facility] after reviewing the case this is likely a major eczema flare and less likely TENS as there is no mucosal involvement; even with significant lymphadenopathy.  Dermatologist recommends IV steroids and topical triamcinolone .  Patient was started on IV Solu-Medrol , topical triamcinolone  with improvement of symptoms.  Inflammatory markers of ESR and CRP were monitored and trended down during hospitalization.  Patient's symptoms improved and will transition to prednisone  taper, continue triamcinolone /Eucerin cream twice daily.  Outpatient follow-up with dermatology as scheduled on 08/24/2024 as likely would benefit from initiation of Dupixent for recurrent severe eczema flares.  Recommend repeat CBC following prednisone  taper.   Anxiety/depression: Celexa  20 mg p.o. daily  Discharge Diagnoses:  Principal Problem:   Sepsis (HCC) Active Problems:   Bipolar 1 disorder (HCC)   Eczema   Tetrahydrocannabinol (THC) use disorder, moderate, dependence (HCC)   Lymphadenopathy, generalized    Discharge Instructions  Discharge Instructions     Call MD for:  difficulty breathing, headache or visual disturbances   Complete by: As directed    Call MD for:  extreme fatigue   Complete by: As directed    Call MD for:  persistant dizziness or light-headedness   Complete by: As directed    Call MD for:  persistant nausea and vomiting   Complete by: As directed    Call MD for:  severe uncontrolled pain   Complete by: As directed    Call MD for:  temperature >100.4   Complete by: As directed    Diet - low sodium heart healthy   Complete by: As directed    Increase activity slowly   Complete by: As directed    No wound care   Complete by: As directed       Allergies as of 08/14/2024       Reactions   Shellfish Allergy Anaphylaxis, Swelling, Other (See Comments)   Caused drooling,  also   Sulfa Drugs Cross Reactors Anaphylaxis, Swelling, Other (See Comments)   Caused drooling, also   Sulfasalazine Anaphylaxis, Swelling, Other (See Comments)   Caused drooling, also        Medication List     STOP taking these medications    Airborne Gummies Chew   albuterol  108 (90 Base) MCG/ACT inhaler Commonly known as: VENTOLIN  HFA   benzonatate  100 MG capsule Commonly known as: TESSALON    ciprofloxacin -dexamethasone  OTIC suspension Commonly known as: CIPRODEX    doxycycline  100 MG capsule Commonly known as: VIBRAMYCIN    famotidine  20 MG tablet Commonly known as: PEPCID    menthol  3 MG lozenge Commonly known as: CEPACOL   pantoprazole  40 MG tablet Commonly known as: PROTONIX    sulfamethoxazole-trimethoprim 800-160 MG tablet Commonly known as: BACTRIM DS   traMADol  50 MG tablet Commonly known as: ULTRAM    VITAMIN C PO       TAKE these medications    cephALEXin  500 MG capsule Commonly known as: KEFLEX  Take 1 capsule (500 mg total) by mouth 4 (four) times daily for 3 days.   ciprofloxacin  0.3 % ophthalmic solution Commonly known as: Ciloxan  Place 1 drop into both eyes every 4 (four) hours while awake. Administer 1 drop, every 2 hours, while awake, for 2 days. Then 1 drop, every 4 hours, while  awake, for the next 5 days.   citalopram  20 MG tablet Commonly known as: CELEXA  Take 20 mg by mouth daily.   cyclobenzaprine 5 MG tablet Commonly known as: FLEXERIL Take 5 mg by mouth 3 (three) times daily as needed.   diclofenac 75 MG EC tablet Commonly known as: VOLTAREN Take 75 mg by mouth 2 (two) times daily.   eucerin cream Apply topically 2 (two) times daily.   ferrous sulfate  325 (65 FE) MG tablet Take 325 mg by mouth every morning. Before breakfast   gabapentin  300 MG capsule Commonly known as: NEURONTIN  Take 300 mg by mouth 3 (three) times daily.   ibuprofen  200 MG tablet Commonly known as: ADVIL  Take 800 mg by mouth every 6 (six)  hours as needed for headache.   Oxycodone  HCl 10 MG Tabs Take 1-1.5 tablets (10-15 mg total) by mouth every 4 (four) hours as needed for moderate pain (pain score 4-6) or severe pain (pain score 7-10). What changed:  medication strength how much to take when to take this reasons to take this   predniSONE  10 MG tablet Commonly known as: DELTASONE  Take 4 tablets (40 mg total) by mouth daily for 3 days, THEN 3 tablets (30 mg total) daily for 3 days, THEN 2 tablets (20 mg total) daily for 3 days, THEN 1 tablet (10 mg total) daily for 3 days. Start taking on: August 15, 2024 What changed: See the new instructions.   QUEtiapine  25 MG tablet Commonly known as: SEROQUEL  Take 25 mg by mouth at bedtime.   sucralfate  1 g tablet Commonly known as: Carafate  Take 1 tablet (1 g total) by mouth 4 (four) times daily -  with meals and at bedtime.   triamcinolone  cream 0.1 % Commonly known as: KENALOG  Apply topically 2 (two) times daily. What changed:  how much to take Another medication with the same name was removed. Continue taking this medication, and follow the directions you see here.               Durable Medical Equipment  (From admission, onward)           Start     Ordered   08/13/24 1425  For home use only DME Walker rolling  Once       Question Answer Comment  Walker: With 5 Inch Wheels   Patient needs a walker to treat with the following condition Gait abnormality      08/13/24 1424            Follow-up Information     Mc[employer]eeks, Suzen Rana, PA-C. Go on 08/24/2024.   Specialty: Dermatology Contact information: [phone] [place] [address] [phone]         [facility] Schedule an appointment as soon as possible for a visit in 1 week(s).   Contact information: [address] [place] [address] [phone]                Allergies  Allergen Reactions   Shellfish Allergy  Anaphylaxis, Swelling and Other (See Comments)    Caused drooling, also   Sulfa Drugs Cross Reactors Anaphylaxis, Swelling and Other (See Comments)    Caused drooling, also   Sulfasalazine Anaphylaxis, Swelling and Other (See Comments)    Caused drooling, also    Consultations: Admitting physician discussed with dermatologist on-call at [facility]    Procedures/Studies: CT Angio Chest PE W and/or Wo Contrast Result  Date: 08/08/2024 EXAM: CTA CHEST AORTA 08/08/2024 12:21:39 AM TECHNIQUE: CTA of the chest was performed after the administration of 75 mL of iohexol  (OMNIPAQUE ) 350 MG/ML injection. Multiplanar reformatted images are provided for review. MIP images are provided for review. Automated exposure control, iterative reconstruction, and/or weight based adjustment of the mA/kV was utilized to reduce the radiation dose to as low as reasonably achievable. COMPARISON: None available. CLINICAL HISTORY: Syncope/presyncope, cerebrovascular cause suspected. FINDINGS: AORTA: No significant atherosclerotic calcification within the thoracic aorta. No thoracic aortic dissection. No aortic aneurysm. MEDIASTINUM: No significant coronary artery calcification. Global cardiac size within normal limits. No pericardial effusion. Central pulmonary arteries are of normal caliber. Esophagus unremarkable. Visualized thyroid is unremarkable. No additional pathologic thoracic adenopathy. LYMPH NODES: There is pathologic bilateral axillary adenopathy present with multiple bulky lymph nodes measuring up to 2.5 cm in short axis diameter. These may be inflammatory, as could be seen with underlying conditions such as sarcoidosis, or lymphoproliferative in nature is to be seen with lymphoma or leukemia. No mediastinal or hilar lymphadenopathy. LUNGS AND PLEURA: The lungs are without acute process. No focal consolidation or pulmonary edema. No pleural effusion or pneumothorax. No pulmonary embolism. UPPER  ABDOMEN: Limited images of the upper abdomen are unremarkable. SOFT TISSUES AND BONES: No acute bone or soft tissue abnormality. IMPRESSION: 1. No acute abnormality of the aorta 2. No pulmonary embolism 3. Pathologic bilateral axillary adenopathy, possibly inflammatory or lymphoproliferative in nature; recommend correlation with clinical history. If indicated, these would be easily amenable to ultrasound-guided tissue sampling. Electronically signed by: Dorethia Molt MD 08/08/2024 12:31 AM EST RP Workstation: [id number]   DG Chest Portable 1 View Result Date: 08/07/2024 CLINICAL DATA:  Recently diagnosed with an ear infection and abscesses under the bilateral axilla, presenting with chest pain and shortness of breath. EXAM: PORTABLE CHEST 1 VIEW COMPARISON:  May 11, 2022 FINDINGS: The heart size and mediastinal contours are within normal limits. Both lungs are clear. The visualized skeletal structures are unremarkable. IMPRESSION: No active disease. Electronically Signed   By: Suzen Dials M.D.   On: 08/07/2024 23:24     Subjective: Patient seen examined bedside, lying in bed.  No specific complaints.  Swelling to face, hands much improved.  Continues with edema to her feet although slightly improved and was able to ambulate with physical therapy with the use of a rolling walker yesterday.  Patient feels she is ready for discharge home.  Has outpatient follow-up scheduled with dermatology on 08/24/2024.  Will continue prednisone  taper, triamcinolone /Eucerin cream twice daily.  No other complaints, questions or concerns at this time.  Denies headache, no dizziness, no chest pain, no palpitations, no shortness of breath, no abdominal pain, no fever/chills/night sweats, no nausea/vomiting/diarrhea, no focal weakness, no fatigue, no paresthesia.  No acute events overnight per nursing staff.  Discharge Exam: Vitals:   08/13/24 2009 08/14/24 0441  BP: 139/61 132/77  Pulse: 66 66  Resp:    Temp:  98.6 F (37 C) 98.2 F (36.8 C)  SpO2: 99% 100%   Vitals:   08/13/24 0531 08/13/24 1352 08/13/24 2009 08/14/24 0441  BP: 127/73 (!) 147/77 139/61 132/77  Pulse: 64 71 66 66  Resp:  16    Temp: 98 F (36.7 C) 99.1 F (37.3 C) 98.6 F (37 C) 98.2 F (36.8 C)  TempSrc: Oral Oral Oral Oral  SpO2: 100% 98% 99% 100%  Weight:      Height:        Physical Exam: GEN: NAD, alert  and oriented x 3, obese HEENT: NCAT, PERRL, EOMI, sclera clear, MMM PULM: CTAB w/o wheezes/crackles, normal respiratory effort, on room air CV: RRR w/o M/G/R GI: abd soft, NTND, + BS MSK: + peripheral edema, moves all extremities independently NEURO: No focal neurological deficit PSYCH: normal mood/affect Integumentary: Skin hyperpigmented, thickened with multiple cracks/fissures, edematous with the discrimination of multiple sites as noted in pictures below.  Pictures were taken at time of hospital admission.                     The results of significant diagnostics from this hospitalization (including imaging, microbiology, ancillary and laboratory) are listed below for reference.     Microbiology: Recent Results (from the past 240 hours)  Culture, blood (single)     Status: None   Collection Time: 08/08/24  1:34 AM   Specimen: BLOOD LEFT FOREARM  Result Value Ref Range Status   Specimen Description   Final    BLOOD LEFT FOREARM Performed at [facility], [address] [place], [place] [address]    Special Requests   Final    BOTTLES DRAWN AEROBIC AND ANAEROBIC Blood Culture adequate volume Performed at [facility], [address] [place], [place] [address]    Culture   Final    NO GROWTH 5 DAYS Performed at [facility], [address], [place], [place] [address]    Report Status 08/13/2024 FINAL  Final     Labs: BNP (last 3 results) No results for input(s): BNP in the last 8760 hours. Basic Metabolic Panel: Recent Labs  Lab  08/07/24 2322 08/09/24 0427  NA 142 142  K 4.1 4.3  CL 105 108  CO2 21* 24  GLUCOSE 93 127*  BUN 7 <5*  CREATININE 0.66 0.67  CALCIUM  8.2* 8.4*   Liver Function Tests: No results for input(s): AST, ALT, ALKPHOS, BILITOT, PROT, ALBUMIN in the last 168 hours. No results for input(s): LIPASE, AMYLASE in the last 168 hours. No results for input(s): AMMONIA in the last 168 hours. CBC: Recent Labs  Lab 08/07/24 2322 08/08/24 1705 08/09/24 0427 08/10/24 0349 08/11/24 0409 08/12/24 0412  WBC 27.9* 18.9* 17.3* 27.2* 25.6* 28.0*  NEUTROABS 13.9*  --   --   --   --   --   HGB 10.8* 9.5* 9.6* 7.9* 8.4* 8.3*  HCT 33.1* 29.8* 30.7* 25.6* 26.5* 26.3*  MCV 75.6* 80.1 79.1* 78.8* 79.1* 78.7*  PLT 528* 435* 475* 421* 432* 423*   Cardiac Enzymes: No results for input(s): CKTOTAL, CKMB, CKMBINDEX, TROPONINI in the last 168 hours. BNP: Invalid input(s): POCBNP CBG: No results for input(s): GLUCAP in the last 168 hours. D-Dimer No results for input(s): DDIMER in the last 72 hours. Hgb A1c No results for input(s): HGBA1C in the last 72 hours. Lipid Profile No results for input(s): CHOL, HDL, LDLCALC, TRIG, CHOLHDL, LDLDIRECT in the last 72 hours. Thyroid function studies No results for input(s): TSH, T4TOTAL, T3FREE, THYROIDAB in the last 72 hours.  Invalid input(s): FREET3 Anemia work up No results for input(s): VITAMINB12, FOLATE, FERRITIN, TIBC, IRON, RETICCTPCT in the last 72 hours. Urinalysis    Component Value Date/Time   COLORURINE YELLOW 08/08/2024 0404   APPEARANCEUR CLOUDY (A) 08/08/2024 0404   LABSPEC 1.015 08/08/2024 0404   PHURINE 5.5 08/08/2024 0404   GLUCOSEU NEGATIVE 08/08/2024 0404   HGBUR LARGE (A) 08/08/2024 0404   BILIRUBINUR NEGATIVE 08/08/2024 0404   KETONESUR 15 (A) 08/08/2024  0404   PROTEINUR 30 (A) 08/08/2024 0404   UROBILINOGEN 0.2 09/05/2013 1700   NITRITE NEGATIVE 08/08/2024 0404    LEUKOCYTESUR TRACE (A) 08/08/2024 0404   Sepsis Labs Recent Labs  Lab 08/09/24 0427 08/10/24 0349 08/11/24 0409 08/12/24 0412  WBC 17.3* 27.2* 25.6* 28.0*   Microbiology Recent Results (from the past 240 hours)  Culture, blood (single)     Status: None   Collection Time: 08/08/24  1:34 AM   Specimen: BLOOD LEFT FOREARM  Result Value Ref Range Status   Specimen Description   Final    BLOOD LEFT FOREARM Performed at [facility], [address] [place], [place] [address]    Special Requests   Final    BOTTLES DRAWN AEROBIC AND ANAEROBIC Blood Culture adequate volume Performed at [facility], [address], [place], [place] [address]    Culture   Final    NO GROWTH 5 DAYS Performed at [facility], [address] [place], [place] [address]    Report Status 08/13/2024 FINAL  Final     Time coordinating discharge: Over 30 minutes  SIGNED:   Camellia PARAS Hollyanne Schloesser, DO  [facility] 08/14/2024, 9:40 AM
